# Patient Record
Sex: Female | Born: 1980
Health system: Southern US, Community
[De-identification: ages and names within clinical notes are randomized; demographics above are authoritative.]

## PROBLEM LIST (undated history)

## (undated) DIAGNOSIS — Z789 Other specified health status: Secondary | ICD-10-CM

## (undated) DIAGNOSIS — K529 Noninfective gastroenteritis and colitis, unspecified: Secondary | ICD-10-CM

## (undated) DIAGNOSIS — O24419 Gestational diabetes mellitus in pregnancy, unspecified control: Secondary | ICD-10-CM

## (undated) HISTORY — PX: OTHER SURGICAL HISTORY: SHX169

## (undated) HISTORY — DX: Noninfective gastroenteritis and colitis, unspecified: K52.9

## (undated) HISTORY — DX: Gestational diabetes mellitus in pregnancy, unspecified control: O24.419

## (undated) HISTORY — PX: NO PAST SURGERIES: SHX2092

---

## 2001-06-20 ENCOUNTER — Emergency Department (HOSPITAL_COMMUNITY): Admission: EM | Admit: 2001-06-20 | Discharge: 2001-06-20 | Payer: Self-pay | Admitting: Emergency Medicine

## 2003-08-21 ENCOUNTER — Emergency Department (HOSPITAL_COMMUNITY): Admission: EM | Admit: 2003-08-21 | Discharge: 2003-08-21 | Payer: Self-pay | Admitting: Emergency Medicine

## 2003-12-20 ENCOUNTER — Other Ambulatory Visit: Admission: RE | Admit: 2003-12-20 | Discharge: 2003-12-20 | Payer: Self-pay | Admitting: Obstetrics and Gynecology

## 2004-11-25 ENCOUNTER — Ambulatory Visit (HOSPITAL_COMMUNITY): Admission: RE | Admit: 2004-11-25 | Discharge: 2004-11-25 | Payer: Self-pay | Admitting: Obstetrics and Gynecology

## 2005-01-29 ENCOUNTER — Other Ambulatory Visit: Admission: RE | Admit: 2005-01-29 | Discharge: 2005-01-29 | Payer: Self-pay | Admitting: Obstetrics and Gynecology

## 2013-01-27 ENCOUNTER — Inpatient Hospital Stay (HOSPITAL_COMMUNITY)
Admission: AD | Admit: 2013-01-27 | Discharge: 2013-01-29 | DRG: 781 | Disposition: A | Discharge status: Home / Self Care | Payer: No Typology Code available for payment source | Source: Ambulatory Visit | Attending: Obstetrics and Gynecology | Admitting: Obstetrics and Gynecology

## 2013-01-27 ENCOUNTER — Encounter (HOSPITAL_COMMUNITY): Payer: Self-pay

## 2013-01-27 ENCOUNTER — Inpatient Hospital Stay (HOSPITAL_COMMUNITY)
Admit: 2013-01-27 | Discharge: 2013-01-27 | Disposition: A | Discharge status: Home / Self Care | Payer: No Typology Code available for payment source

## 2013-01-27 ENCOUNTER — Encounter (HOSPITAL_COMMUNITY): Payer: No Typology Code available for payment source

## 2013-01-27 ENCOUNTER — Encounter (HOSPITAL_COMMUNITY): Payer: Self-pay | Admitting: *Deleted

## 2013-01-27 ENCOUNTER — Encounter (HOSPITAL_COMMUNITY): Payer: Self-pay | Admitting: Obstetrics and Gynecology

## 2013-01-27 DIAGNOSIS — O3090 Multiple gestation, unspecified, unspecified trimester: Secondary | ICD-10-CM | POA: Diagnosis present

## 2013-01-27 DIAGNOSIS — IMO0001 Reserved for inherently not codable concepts without codable children: Secondary | ICD-10-CM

## 2013-01-27 DIAGNOSIS — O99810 Abnormal glucose complicating pregnancy: Principal | ICD-10-CM | POA: Diagnosis present

## 2013-01-27 DIAGNOSIS — O24410 Gestational diabetes mellitus in pregnancy, diet controlled: Secondary | ICD-10-CM

## 2013-01-27 DIAGNOSIS — O30009 Twin pregnancy, unspecified number of placenta and unspecified number of amniotic sacs, unspecified trimester: Secondary | ICD-10-CM | POA: Diagnosis present

## 2013-01-27 DIAGNOSIS — O365930 Maternal care for other known or suspected poor fetal growth, third trimester, not applicable or unspecified: Secondary | ICD-10-CM | POA: Diagnosis present

## 2013-01-27 DIAGNOSIS — O365990 Maternal care for other known or suspected poor fetal growth, unspecified trimester, not applicable or unspecified: Secondary | ICD-10-CM | POA: Diagnosis present

## 2013-01-27 DIAGNOSIS — O30049 Twin pregnancy, dichorionic/diamniotic, unspecified trimester: Secondary | ICD-10-CM | POA: Diagnosis present

## 2013-01-27 DIAGNOSIS — O30003 Twin pregnancy, unspecified number of placenta and unspecified number of amniotic sacs, third trimester: Secondary | ICD-10-CM | POA: Diagnosis present

## 2013-01-27 DIAGNOSIS — O30002 Twin pregnancy, unspecified number of placenta and unspecified number of amniotic sacs, second trimester: Secondary | ICD-10-CM

## 2013-01-27 HISTORY — DX: Other specified health status: Z78.9

## 2013-01-27 MED ORDER — PRENATAL MULTIVITAMIN CH
1.0000 | ORAL_TABLET | Freq: Every day | ORAL | Status: DC
  Administered 2013-01-28 – 2013-01-29 (×2): 1 via ORAL
  Filled 2013-01-27 (×2): qty 1

## 2013-01-27 MED ORDER — ZOLPIDEM TARTRATE 5 MG PO TABS: 5.0000 mg | ORAL_TABLET | Freq: Every evening | ORAL | Status: DC | PRN

## 2013-01-27 MED ORDER — ACETAMINOPHEN 325 MG PO TABS
650.0000 mg | ORAL_TABLET | ORAL | Status: DC | PRN
  Administered 2013-01-27: 650 mg via ORAL
  Filled 2013-01-27: qty 2

## 2013-01-27 MED ORDER — POLYSACCHARIDE IRON COMPLEX 150 MG PO CAPS
150.0000 mg | ORAL_CAPSULE | Freq: Every day | ORAL | Status: DC
  Administered 2013-01-27 – 2013-01-29 (×3): 150 mg via ORAL
  Filled 2013-01-27 (×4): qty 1

## 2013-01-27 MED ORDER — DOCUSATE SODIUM 100 MG PO CAPS: 100.0000 mg | ORAL_CAPSULE | Freq: Every day | ORAL | Status: DC

## 2013-01-27 MED ORDER — CALCIUM CARBONATE ANTACID 500 MG PO CHEW: 2.0000 | CHEWABLE_TABLET | ORAL | Status: DC | PRN

## 2013-01-27 NOTE — Progress Notes (Addendum)
Pt stating monitors and belts were becoming uncomfortable and she didn't think she would be able to sleep with the nurse having to come and adjust the monitors so frequently. Called Dr. Cherly Hensen to notify her of patient's complaint at this time; she ordered to keep monitors in place at this time.

## 2013-01-27 NOTE — Consult Note (Signed)
Maternal Fetal Medicine Consultation  Requesting Provider(s): Maxie Better, MD  Reason for consultation: Twin gestation, growth discordance, possible growth restriction of Twin B  HPI: Felicia [name] [place] a 32 yo G3P0020 currently at 69 5/7 weeks with spontaneous DC/DA twin gestation noted earlier today to have significant inter twin growth discordance and growth lag of Twin B.  Her prenatal course has otherwise been uncomplicated.  Her 1-hr OGTT was 156 mg/dl and plans to do a 3-hr OGTT tomorrow and will get her first dose of betamethasone once complete.  She reports that both twins are active, denies contractions, vaginal bleeding or leakage of fluid.  OB History: OB History   Grav Para Term Preterm Abortions TAB SAB Ect Mult Living   3 0 0 0 2 1 1 0 0 0       PMH:  Past Medical History  Diagnosis Date  . Medical history non-contributory     PSH:  Past Surgical History  Procedure Laterality Date  . No past surgeries     Meds:  Scheduled Meds: . docusate sodium  100 mg Oral Daily  . prenatal multivitamin  1 tablet Oral Q1200   Continuous Infusions:  PRN Meds:.acetaminophen, calcium carbonate, zolpidem  Allergies: No Known Allergies  FH: Denies family history of birth defects or hereditary disorders  Soc: denies smoking or ETOH use since becoming pregnant  Review of Systems: no vaginal bleeding or cramping/contractions, no LOF, no nausea/vomiting. All other systems reviewed and are negative.   PE:   Filed Vitals:   01/27/13 1554  BP: 120/77  Pulse: 87  Temp: 98 F (36.7 C)  Resp: 16    GEN: well-appearing female ABD: gravid, NT  Ultrasound:  DC/DA twin gestation with best dates of 28 5/7 weeks A 19 % inter twin growth discordance is noted   A: cephalic, posterior placenta, female      Normal fetal anatomic survey      Estimated fetal weight at the 42nd %tile      Normal amniotic fluid volume (MVP 4.5 cm)  B: breech, posterior placenta, female  Normal fetal anatomic survey      Estimated fetal weight at the 19th %tile; AC measures at the 9th %tile      Normal amniotic fluid volume (MVP 4.6 cm)      UA Dopplers elevated for gestational age, but no AEDF or reversed diastolic flow  A/P: 1) DC/DA twin gestation at 54 5/7 weeks         2) Lagging growth of Twin B (19th %tile) with an AC at the 9th %tile.  Elevated UA Dopplers without absent or reversed diastolic flow - findings and limitations of the ultrasound study were reviewed with the patient and her husband.         3) Abnormal 1-hr OGTT, 3-hr test to be completed tomorrow  Recommendations: - concur with admission and plans for betamethasone series once 3-hr test is complete - After betamethasone is complete, would feel comfortable with outpatient management.  Would recommend weekly BPPs with weekly UA Doppler studies.  May transition to 2x weekly NSTs with weekly UA Doppler studies after 32 weeks or continue weekly BPPs. - Follow up growth scan in 3 weeks - If testing remains reassuring, would move toward delivery at 36-37 weeks.  If marginal interval growth or AEDF/REDF noted on UA Dopplers, would move toward earlier delivery.  Please contact our office if you would prefer that the follow up ultrasound studies be performed with MFM.  Thank you for the opportunity to be a part of the care of Felicia [name]. Please contact our office if we can be of further assistance.   I spent approximately 30 minutes with this patient with over 50% of time spent in face-to-face counseling.  Alpha Gula, MD Maternal Fetal Medicine

## 2013-01-28 DIAGNOSIS — O30003 Twin pregnancy, unspecified number of placenta and unspecified number of amniotic sacs, third trimester: Secondary | ICD-10-CM | POA: Diagnosis present

## 2013-01-28 DIAGNOSIS — O365930 Maternal care for other known or suspected poor fetal growth, third trimester, not applicable or unspecified: Secondary | ICD-10-CM | POA: Diagnosis present

## 2013-01-28 DIAGNOSIS — O30049 Twin pregnancy, dichorionic/diamniotic, unspecified trimester: Secondary | ICD-10-CM | POA: Diagnosis present

## 2013-01-28 LAB — GLUCOSE, 2 HOUR GESTATIONAL: Glucose Tolerance, 2 hour: 185 mg/dL — ABNORMAL HIGH (ref 70–164)

## 2013-01-28 NOTE — H&P (Signed)
CC: elevated doppler, abnl fetal testing( twin B)  HPI; 32 yo G1P0020 MWF w/ known DiDI twin gestation now @ 28 5/[redacted] weeks gestation admitted for further evaluation and mgmt of elevated doppler( twinB) and fetal growth discordancy( >26%) on sonogram done today for serial fetal growth. Sono : 3lb 1 oz(85%), 2lb 4oz( 35%), vtx/oblique breech, nl AFI, nl doppler ( twin A) elev dopplers( twin B). Pt also had elev 1hr GCT today. Pt notes good FM  PMH: NKDA Med PNV Medical none Surgery: D&E OB: TAB x 1 SAB x 1 FH noncontributory SH married nonsmoker ROS neg PE: gravid WDWN F in NAD  Skin (-)lesion HEENT: anicteric sclera. Pink conjunctiva oropharynx neg Cor RRR Lungs clear to A Abdomen: gravid non tender Pelvic: closed/long/OOP Extr: no edema or calf tenderness  IMP: Twin gestation@ 28 5/7 weeks Fetal growth discrepancy abnl 1hr GCT elev doppler( twin B) Anemia P) Admit MFM consult. 3hr GTT in am. Iron supplement. Cont fetal monitoring. BMZ depending on glucose testing

## 2013-01-28 NOTE — Progress Notes (Signed)
efm removed

## 2013-01-28 NOTE — Progress Notes (Signed)
HD #2  28 6/7 weeks Twins  S; no complaint. 3h GTT in progress   O: lungs clear to A Cor RRR abd gravid soft Extr: no edema or calf tenderness  Tracing: twin A baseline 140 Twin B baseline 155 good variability some accelerations. irregular ctx  FBS 86 1 hr= 238 2 hr=185 3hr pending  MFM note reviewed w/ pt and husband. Note appreciated  IMP: newly diagnosed GDM Twin gestation @ 28 6/7 weeks Fetal growth discordancy elevated dopplers ( twin B)  P) will get nutrition/diabetic counselling. FBS, 2hr postprandial glucose. Defer BMZ currently  Change to NST  TID. Disc outpt fetal surveillance. Will do with MFM office

## 2013-01-29 MED ORDER — POLYSACCHARIDE IRON COMPLEX 150 MG PO CAPS: 150.0000 mg | ORAL_CAPSULE | Freq: Two times a day (BID) | ORAL | Status: DC

## 2013-01-29 NOTE — Progress Notes (Signed)
Pt. Requesting to have vs and assessment done at later time this morning.

## 2013-01-29 NOTE — Progress Notes (Signed)
  Nutrition Dx: Food and nutrition-related knowledge deficit r/t no previous education aeb newly diagnosed GDM.    Nutrition education consult for Carbohydrate Modified Gestational Diabetic Diet completed.  "Meal  plan for gestational diabetics" handout given to patient and husband.  Basic concepts reviewed. CHO limit increased to 45 g at breakfast, and 60 g at lunch and dinner for twin pregnancy.  Questions answered.  Patient verbalizes understanding.  Elisabeth Cara M.Odis Luster LDN Neonatal Nutrition Support Specialist Pager [phone]

## 2013-01-29 NOTE — Progress Notes (Addendum)
Fingerstick blood glucose was 79 at 11:30 a.m.  Pt. Aware.  Had visit from hospital dietician today to discuss food choices and preparation at home.  Pt instructed to keep a daily record of blood glucose levels and report abnormal values to her physician.  Pt. For discharge home today s/p NST for twins "A" and "B".   Pt. Is currently on the [facility].

## 2013-01-29 NOTE — Progress Notes (Signed)
TWIN gestation @ 29 weeks Growth discordancy GDM  S: no complaint (+) FM x 2. Ctx not perceived by pt Saw nutirition today  O: VSS afebrile Lungs clear to A  Cor RRR Abdomen: gravid nontender Pelvic deferred Extr no edema  FBS 80 2hr postprandial yest <100  Tracing: baseline 140 (A) Baseline 155   Mod variability, small accel irreg ctx  IMP: Gest DM diet controlled Twin gestation @ 29 weeks w/ growth discrepancy and elev dopplers twinB P) d/c home.  outpt mgmt fetal surveillance( BPP, dopplers weekly). Office will arrange for diabetic supplies.  Will sched OB f/u this week If BS remains as such, will then do BMZ occ ctx PTL prec. Daily kick ct.

## 2013-01-30 LAB — GLUCOSE, CAPILLARY
Glucose-Capillary: 90 mg/dL (ref 70–99)
Glucose-Capillary: 84 mg/dL (ref 70–99)
Glucose-Capillary: 79 mg/dL (ref 70–99)

## 2013-01-31 ENCOUNTER — Other Ambulatory Visit (HOSPITAL_COMMUNITY): Payer: Self-pay | Admitting: Obstetrics and Gynecology

## 2013-01-31 DIAGNOSIS — O30049 Twin pregnancy, dichorionic/diamniotic, unspecified trimester: Secondary | ICD-10-CM

## 2013-02-02 ENCOUNTER — Other Ambulatory Visit (HOSPITAL_COMMUNITY): Payer: Self-pay | Admitting: Obstetrics and Gynecology

## 2013-02-02 ENCOUNTER — Encounter (HOSPITAL_COMMUNITY): Payer: Self-pay

## 2013-02-02 ENCOUNTER — Ambulatory Visit (HOSPITAL_COMMUNITY)
Admission: RE | Admit: 2013-02-02 | Discharge: 2013-02-02 | Disposition: A | Discharge status: Home / Self Care | Payer: No Typology Code available for payment source | Source: Ambulatory Visit | Attending: Obstetrics and Gynecology | Admitting: Obstetrics and Gynecology

## 2013-02-02 VITALS — BP 131/93 | HR 106 | Wt 161.0 lb

## 2013-02-02 DIAGNOSIS — O30049 Twin pregnancy, dichorionic/diamniotic, unspecified trimester: Secondary | ICD-10-CM

## 2013-02-02 DIAGNOSIS — IMO0001 Reserved for inherently not codable concepts without codable children: Secondary | ICD-10-CM

## 2013-02-02 DIAGNOSIS — O365930 Maternal care for other known or suspected poor fetal growth, third trimester, not applicable or unspecified: Secondary | ICD-10-CM

## 2013-02-02 DIAGNOSIS — O99810 Abnormal glucose complicating pregnancy: Secondary | ICD-10-CM | POA: Insufficient documentation

## 2013-02-02 DIAGNOSIS — O0930 Supervision of pregnancy with insufficient antenatal care, unspecified trimester: Secondary | ICD-10-CM | POA: Insufficient documentation

## 2013-02-02 DIAGNOSIS — O30002 Twin pregnancy, unspecified number of placenta and unspecified number of amniotic sacs, second trimester: Secondary | ICD-10-CM

## 2013-02-02 DIAGNOSIS — O30009 Twin pregnancy, unspecified number of placenta and unspecified number of amniotic sacs, unspecified trimester: Secondary | ICD-10-CM | POA: Insufficient documentation

## 2013-02-02 NOTE — Progress Notes (Signed)
Felicia [name]  was seen today for an ultrasound appointment.  See full report in [facility].  Impression: DC/DA twin gestation with best dates of 29 4/7 weeks Follow up due to discordant growth, Twin B with lagging AC and elevated UA Dopplers BPP 8/8 x 2 Normal amniotic fluid volume x 2 UA Dopplers: A- normal for gestational age B- elevated for gestatonal age, but no AEDF or reversed flow  BP: 131/93, 136/91  Recommendations: Continue weekly BPPs with UA Dopplers. Follow up growth scan in 2 weeks  Would have a low threshold to check preeclampsia labs if BPs remain elevated  Alpha Gula, MD

## 2013-02-07 NOTE — Discharge Summary (Signed)
Obstetric Discharge Summary Reason for Admission: twin gestation growth discordancy, elevated dopplers( twin B),  IUP @ 28 5/7 weeks Prenatal Procedures: NST and ultrasound Intrapartum Procedures: none Postpartum Procedures: n/a Complications-Operative and Postpartum: n/a No results found for this basename: hgb, hct    Physical Exam:  General: alert, cooperative and no distress Lungs clear to A Cor RRR Abdomen: gravid nontender DVT Evaluation: No evidence of DVT seen on physical exam . Hospital course: pt was admitted to hospital after routine visit and fetal growth scan showed fetal growth discrepancy with Twin B also having elevated dopplers. Please see H&P for details. Pt also had elevated 1hr GCT. Pt was placed on continuous fetal monitoring and had MFM consult which concurred with diagnosis. 3hr GTT was performed and diagnosis of GDM made. Nutrition consult was done. BS were normal. BMZ deferred at this time. Weekly fetal surveillance planned and outpt management of BS w/ appropriate BS monitor per pt's insurance  Discharge Diagnoses: Gestational diabetes, twin gestation discordant growrth, elevated dopplers twin B, IUP @ 29 weeks  Discharge Information: Date: 02/07/2013 Activity: bed rest Diet: routine and CHO modified diet Medications: PNV Condition: stable Instructions: daily kick ct, PTL prec Discharge to: home Follow-up Information   Follow up with Jazmyne Beauchesne A, MD In 1 week. (office will sched MFM appt, ROB appt 7/28)    Contact information:   [address] [place] [address] [phone]       Aadin Gaut A 02/07/2013, 8:47 AM

## 2013-02-09 ENCOUNTER — Ambulatory Visit (HOSPITAL_COMMUNITY)
Admission: RE | Admit: 2013-02-09 | Discharge: 2013-02-09 | Disposition: A | Discharge status: Home / Self Care | Payer: No Typology Code available for payment source | Source: Ambulatory Visit | Attending: Obstetrics and Gynecology | Admitting: Obstetrics and Gynecology

## 2013-02-09 ENCOUNTER — Inpatient Hospital Stay (HOSPITAL_COMMUNITY)
Admission: AD | Admit: 2013-02-09 | Discharge: 2013-03-03 | DRG: 765 | Disposition: A | Discharge status: Home / Self Care | Payer: No Typology Code available for payment source | Source: Ambulatory Visit | Attending: Obstetrics and Gynecology | Admitting: Obstetrics and Gynecology

## 2013-02-09 ENCOUNTER — Encounter (HOSPITAL_COMMUNITY): Payer: Self-pay | Admitting: *Deleted

## 2013-02-09 DIAGNOSIS — O30049 Twin pregnancy, dichorionic/diamniotic, unspecified trimester: Secondary | ICD-10-CM

## 2013-02-09 DIAGNOSIS — O329XX0 Maternal care for malpresentation of fetus, unspecified, not applicable or unspecified: Principal | ICD-10-CM | POA: Diagnosis present

## 2013-02-09 DIAGNOSIS — IMO0001 Reserved for inherently not codable concepts without codable children: Secondary | ICD-10-CM

## 2013-02-09 DIAGNOSIS — O365990 Maternal care for other known or suspected poor fetal growth, unspecified trimester, not applicable or unspecified: Secondary | ICD-10-CM | POA: Insufficient documentation

## 2013-02-09 DIAGNOSIS — O99810 Abnormal glucose complicating pregnancy: Secondary | ICD-10-CM | POA: Insufficient documentation

## 2013-02-09 DIAGNOSIS — O3090 Multiple gestation, unspecified, unspecified trimester: Principal | ICD-10-CM | POA: Diagnosis present

## 2013-02-09 DIAGNOSIS — D689 Coagulation defect, unspecified: Secondary | ICD-10-CM | POA: Diagnosis not present

## 2013-02-09 DIAGNOSIS — D696 Thrombocytopenia, unspecified: Secondary | ICD-10-CM | POA: Diagnosis not present

## 2013-02-09 DIAGNOSIS — D649 Anemia, unspecified: Secondary | ICD-10-CM | POA: Diagnosis not present

## 2013-02-09 DIAGNOSIS — O30009 Twin pregnancy, unspecified number of placenta and unspecified number of amniotic sacs, unspecified trimester: Secondary | ICD-10-CM | POA: Insufficient documentation

## 2013-02-09 DIAGNOSIS — O30003 Twin pregnancy, unspecified number of placenta and unspecified number of amniotic sacs, third trimester: Secondary | ICD-10-CM | POA: Diagnosis present

## 2013-02-09 DIAGNOSIS — O0930 Supervision of pregnancy with insufficient antenatal care, unspecified trimester: Secondary | ICD-10-CM | POA: Insufficient documentation

## 2013-02-09 DIAGNOSIS — O365930 Maternal care for other known or suspected poor fetal growth, third trimester, not applicable or unspecified: Secondary | ICD-10-CM

## 2013-02-09 DIAGNOSIS — O99814 Abnormal glucose complicating childbirth: Secondary | ICD-10-CM | POA: Diagnosis present

## 2013-02-09 DIAGNOSIS — O365920 Maternal care for other known or suspected poor fetal growth, second trimester, not applicable or unspecified: Secondary | ICD-10-CM

## 2013-02-09 DIAGNOSIS — O9903 Anemia complicating the puerperium: Secondary | ICD-10-CM | POA: Diagnosis not present

## 2013-02-09 LAB — SAMPLE TO BLOOD BANK

## 2013-02-09 LAB — CBC
Hemoglobin: 12.1 g/dL (ref 12.0–15.0)
MCH: 29.6 pg (ref 26.0–34.0)
MCHC: 34 g/dL (ref 30.0–36.0)

## 2013-02-09 LAB — TYPE AND SCREEN: ABO/RH(D): A POS

## 2013-02-09 LAB — ABO/RH: ABO/RH(D): A POS

## 2013-02-09 LAB — COMPREHENSIVE METABOLIC PANEL
ALT: 10 U/L (ref 0–35)
AST: 14 U/L (ref 0–37)
Alkaline Phosphatase: 135 U/L — ABNORMAL HIGH (ref 39–117)
Calcium: 9.4 mg/dL (ref 8.4–10.5)
GFR calc Af Amer: >90 mL/min (ref >90)
Glucose, Bld: 72 mg/dL (ref 70–99)
Potassium: 3.8 mEq/L (ref 3.5–5.1)
Sodium: 135 mEq/L (ref 135–145)
Total Protein: 6.4 g/dL (ref 6.0–8.3)

## 2013-02-09 MED ORDER — BETAMETHASONE SOD PHOS & ACET 6 (3-3) MG/ML IJ SUSP
12.0000 mg | Freq: Once | INTRAMUSCULAR | Status: AC
  Administered 2013-02-09: 12 mg via INTRAMUSCULAR
  Filled 2013-02-09: qty 2

## 2013-02-09 MED ORDER — CALCIUM CARBONATE ANTACID 500 MG PO CHEW
2.0000 | CHEWABLE_TABLET | ORAL | Status: DC | PRN
  Administered 2013-02-11 (×2): 400 mg via ORAL
  Filled 2013-02-09 (×2): qty 2

## 2013-02-09 MED ORDER — MAGNESIUM SULFATE 40 G IN LACTATED RINGERS - SIMPLE
2.0000 g/h | INTRAVENOUS | Status: AC
  Filled 2013-02-09: qty 500

## 2013-02-09 MED ORDER — MAGNESIUM SULFATE BOLUS VIA INFUSION
4.0000 g | Freq: Once | INTRAVENOUS | Status: AC
  Administered 2013-02-09: 4 g via INTRAVENOUS
  Filled 2013-02-09: qty 500

## 2013-02-09 MED ORDER — ACETAMINOPHEN 325 MG PO TABS
650.0000 mg | ORAL_TABLET | ORAL | Status: DC | PRN
  Administered 2013-02-12 – 2013-02-22 (×9): 650 mg via ORAL
  Filled 2013-02-09 (×9): qty 2

## 2013-02-09 MED ORDER — POLYETHYLENE GLYCOL 3350 17 G PO PACK
17.0000 g | PACK | Freq: Every day | ORAL | Status: DC
  Filled 2013-02-09 (×2): qty 1

## 2013-02-09 MED ORDER — DOCUSATE SODIUM 100 MG PO CAPS
100.0000 mg | ORAL_CAPSULE | Freq: Every day | ORAL | Status: DC
  Administered 2013-02-10 – 2013-02-26 (×17): 100 mg via ORAL
  Filled 2013-02-09 (×17): qty 1

## 2013-02-09 MED ORDER — ZOLPIDEM TARTRATE 5 MG PO TABS: 5.0000 mg | ORAL_TABLET | Freq: Every evening | ORAL | Status: DC | PRN

## 2013-02-09 MED ORDER — POLYETHYLENE GLYCOL 3350 17 G PO PACK
17.0000 g | PACK | Freq: Every day | ORAL | Status: DC
  Administered 2013-02-09: 17 g via ORAL
  Filled 2013-02-09: qty 1

## 2013-02-09 MED ORDER — LACTATED RINGERS IV SOLN
INTRAVENOUS | Status: DC
  Administered 2013-02-09 – 2013-02-27 (×3): via INTRAVENOUS

## 2013-02-09 MED ORDER — BETAMETHASONE SOD PHOS & ACET 6 (3-3) MG/ML IJ SUSP
12.0000 mg | Freq: Once | INTRAMUSCULAR | Status: AC
  Administered 2013-02-10: 12 mg via INTRAMUSCULAR
  Filled 2013-02-09: qty 2

## 2013-02-09 MED ORDER — PRENATAL MULTIVITAMIN CH
1.0000 | ORAL_TABLET | Freq: Every day | ORAL | Status: DC
  Administered 2013-02-09 – 2013-02-26 (×18): 1 via ORAL
  Filled 2013-02-09 (×17): qty 1

## 2013-02-09 NOTE — Progress Notes (Signed)
Neo consult done

## 2013-02-09 NOTE — Consult Note (Signed)
Neonatology Consult  Note:  At the request of the patients obstetrician Dr. Cherly Hensen I met with Ms. Fayrene Fearing who is at 30 4 wks currently with pregnancy complicated by  DC/DA twin gestation with discordant growth: Twin B with lagging AC and elevated UA dopplers - 2 tracings with reversed diastolic flow; remainder with absent end diastolic flow.  BPP was 6/8 x 2 with normal amniotic fluid volume.  Currently admitted for fetal monitoring, Betamethasone, magnesium sulfate for neuroprotection and daily Dopplers and BPPs. We reviewed initial delivery room management, including CPAP, [place], and low but certainly possible need for intubation for surfactant administration.  We discussed feeding immaturity and need for full po intake with multiple days of good weight gain and no apnea or bradycardia before discharge.  We reviewed increased risk of jaundice, infection, and temperature instability.   Discussed likely length of stay.  Thank you for allowing [place] to participate in her care.   John Giovanni, DO   Neonatologist The total length of face-to-face or floor / unit time for this encounter was 25 minutes.  Counseling and / or coordination of care was greater than fifty percent of the time.

## 2013-02-09 NOTE — Progress Notes (Signed)
Mag Bolus Started

## 2013-02-09 NOTE — Progress Notes (Signed)
Felicia [name]  was seen today for an ultrasound appointment.  See full report in [facility].  Impression: DC/DA twin gestation with best dates of 30 4/7 weeks Follow up due to discordant growth, Twin B with lagging AC and elevated UA Dopplers BPP 6/8 x 2 (absent breathing movement in both twins) Normal amniotic fluid volume x 2 UA Dopplers: A- normal for gestational age B- 2 tracings with reversed diastolic flow; remainder with absent end diastolic flow Normal ductus venosus waverform  Recommendations: Recommend admission for fetal monitoring - discussed with Dr. Cherly Hensen Betamethasone Daily UA Dopplers and BPPs  Would move toward delivery for non reassuring fetal tracing, persistent reversed diastolic flow on UA Dopplers or abnormal Ductus venosus waveforms on Twin B.  Alpha Gula, MD

## 2013-02-10 ENCOUNTER — Inpatient Hospital Stay (HOSPITAL_COMMUNITY): Payer: No Typology Code available for payment source

## 2013-02-10 LAB — GLUCOSE, CAPILLARY
Glucose-Capillary: 96 mg/dL (ref 70–99)
Glucose-Capillary: 106 mg/dL — ABNORMAL HIGH (ref 70–99)
Glucose-Capillary: 115 mg/dL — ABNORMAL HIGH (ref 70–99)

## 2013-02-10 MED ORDER — POLYETHYLENE GLYCOL 3350 17 G PO PACK
17.0000 g | PACK | Freq: Every day | ORAL | Status: DC
  Administered 2013-02-10 – 2013-02-25 (×16): 17 g via ORAL
  Filled 2013-02-10 (×17): qty 1

## 2013-02-10 NOTE — H&P (Signed)
CC: reverse /absent flow doppler, abnl fetal testing( twin B)  HPI; 32 yo G3P0020 MWF w/ known DiDI twin gestation now @ 30 4/[redacted] weeks gestation re-admitted for further evaluation and mgmt of now reverse/absent doppler  flow( twinB).  Pt was admitted two weeks ago for  fetal growth discordancy( >26%) on sonogram  and elevated dopplers( twin B) Sono : 3lb 1 oz(85%), 2lb 4oz( 35%), vtx/oblique breech, nl AFI, nl doppler ( twin A) elev dopplers( twin B) at that time. During that admission pt was diagnosed with gestational diabetes. Today,  MFM evaluation showed BPP 6/8 x 2 and reverse/absent diastolic flow on twin B  PMH: NKDA Med PNV Medical none Surgery: D&E OB: TAB x 1 SAB x 1 FH noncontributory SH married nonsmoker ROS neg PE: gravid WDWN F in NAD VS BP 123/90, 127/75, 98.2 P82 Skin (-)lesion HEENT: anicteric sclera. Pink conjunctiva oropharynx neg Cor RRR Lungs clear to A Abdomen: gravid non tender Pelvic: deferred Extr: no edema or calf tenderness  IMP: Twin( DC/DA) gestation@ 30 4/7 weeks Fetal growth discrepancy Gestational diabetes Abnl doppler studies( twin B): reverse/absent diastolic flow Anemia  Tracing; baseline A 130 (+) accel  twin B: baseline 125-130 (+) l accels. irreg ctx  P) Admit.  BMZ. BS testing. NICU consult. Daily BPP, dopplers. Cont fetal monitoring. GBS cx . Magnesium sulfate for neuro prophylaxis.

## 2013-02-10 NOTE — Progress Notes (Signed)
Nutrition Note  Chart reviewed due to gestational diabetes diet order. Patient has had gestational diabetes diet education recently. She is diet controlled and glucoses usually run in the 80-90 range. She reports one increased glucose today (126) likely due to steroids. Patient is receiving a CHO-modified gestational diet. Due to twin pregnancy, she receives 45 gm CHO with breakfast and 60 gm with lunch and dinner. Will add snacks between meals to ensure adequate intake and to maintain optimal glucose control. Please consult RD if further nutrition concerns arise.  Joaquin Courts, RD, LDN, CNSC Pager [phone] After Hours Pager [phone]

## 2013-02-10 NOTE — Progress Notes (Signed)
HD #2  30 5/7 weeks BMZ #1 yesterday Magnesium sulfate ( 2g/hr) until 2p Twins( discordant growth) abnl dopplers( twin B)   S: no complaint. Felt more movement last night. Have n't eaten as yet  O: VS BP 88/72 98.3  P78  Lungs clear to A Cor RRR Abd gravid nontender Extr(-)edema or calf tenderness  Tracing: A; baseline 120 -125 (+) accel to 140-145 Twin B: baseline 130 (+) accels to 140-145 irreg ctx  FBS 124  PIH labs nl  IMP: Twin gestation w/ growth discordancy and 2nd twin with abnl( reverse/absent diastolic flow) currently completing Magnesium sulfate IV for neuroprophylaxis,2nd dose of BMZ today.  IUP @ 30 5/7 Class A1 GDM with elev BS due to steroids P) cont inpt mgmt. Disc magnesium IV only for 12 hrs. Dopplers/BPP today. Cont fetal monitoring

## 2013-02-11 ENCOUNTER — Inpatient Hospital Stay (HOSPITAL_COMMUNITY): Payer: No Typology Code available for payment source

## 2013-02-11 LAB — GLUCOSE, CAPILLARY
Glucose-Capillary: 132 mg/dL — ABNORMAL HIGH (ref 70–99)
Glucose-Capillary: 106 mg/dL — ABNORMAL HIGH (ref 70–99)
Glucose-Capillary: 92 mg/dL (ref 70–99)

## 2013-02-11 MED ORDER — PANTOPRAZOLE SODIUM 40 MG PO TBEC
40.0000 mg | DELAYED_RELEASE_TABLET | Freq: Every day | ORAL | Status: DC
  Administered 2013-02-11 – 2013-02-26 (×16): 40 mg via ORAL
  Filled 2013-02-11 (×18): qty 1

## 2013-02-11 NOTE — Progress Notes (Signed)
HD #3  30 6/7 weeks BMZ complete Magnesium sulfate complete Twins( discordant growth) abnl dopplers( twin B)   S: c/o reflux  (+) FM no ctx  O:last night:  98.7  BP 124/75 VS not available this am  Lungs clear to A Cor RRR Abd gravid nontender Extr(-)edema or calf tenderness  Tracing: B; baseline 130 -135 (+) accel to 160 Twin A: baseline 140 (+) accels to 160 irreg ctx 8/8: 124    106 115 8/9 132 sono yesterday: vtx/vtx  BPP 6/8 x 2, nl fluid. Twin B w/ elevation of doppler studies  and some absent diastolic flow GBS cx pending IMP: Twin gestation w/ growth discordancy and 2nd twin with abnl dopplers but stable,. Reassuring fetal tracing IUP @ 30 6/7 weeks Class A1 GDM with elev BS due to steroids GERD P) cont inpt mgmt. Cont BS testing. BPP/dopplers today. HOB elevation and add protonix

## 2013-02-11 NOTE — Progress Notes (Signed)
[id number]:  Pt to U/S then up to shower.  Not on EFM at this time.

## 2013-02-12 ENCOUNTER — Inpatient Hospital Stay (HOSPITAL_COMMUNITY): Payer: No Typology Code available for payment source

## 2013-02-12 LAB — GLUCOSE, CAPILLARY
Glucose-Capillary: 87 mg/dL (ref 70–99)
Glucose-Capillary: 67 mg/dL — ABNORMAL LOW (ref 70–99)

## 2013-02-12 LAB — CULTURE, BETA STREP (GROUP B ONLY)

## 2013-02-12 NOTE — Progress Notes (Addendum)
ANTEPARTUM NOTE - HD #4  Subjective: Reports feeling tired, not sleeping well Tolerating po intake / no nausea / no vomiting  Voiding QS Report no bleeding or LOF, mild cramping at times Fetal activity present       Objective: Vital signs: VS: Blood pressure 118/74, pulse 79, temperature 97.8 F (36.6 C), temperature source Oral, resp. rate 18, height 5\' 6"  (1.676 m), weight 73.483 kg (162 lb), last menstrual period 06/05/2012, SpO2 99.00%.  SONO:  BPP: Twin A: 6/8, Twin B: 8/8 Dopplers: Twin A-normal, Twin B-normal  Physical exam: General appearance/behavior: alert and oriented x3       Fetal Assessment: FHR A-155, moderate variability, +accels, no decels FHR B-145, moderate variability, + accels, no decels TOCO neg  Assessment: 31 weeks twin gestation, growth discordance, abnormal doppler twin B A1GDM-stable FHR category I  Plan:  Continue BS monitoring Daily BPP/dopplers Continue inpatient managment   Donette Larry, N MSN, CNM 02/12/2013, 12:37 PM  Pt's history and u/s reviewed. U/s from today showing nl dopplers twin A. Twin B with elevated dopplers >97% and intermittent absent EDF, no reversal of flow. MFM reccs repeat dopplers tomorrow. A1GDM, nl BS today. Will plan continuous monitoring o/n and re-eval dopplers tomorrow. NPO after breakfast in am, given possibility of c/s if dopplers consistently absent on twin B, also in breech presentation.

## 2013-02-12 NOTE — Progress Notes (Signed)
Obstetric ultrasound performed today.   Study limited today to evaluation of BPP and umbilical artery Doppler measurements.   Dichorionic/Diamniotic twin gestation at 30w 6d Borderline discordance (19% on 01/27/13)  A: Cephalic, posterior placenta, female      Normal amniotic fluid volume       Equivocal BPP      Normal umbilical artery Doppler measurements            Appropriate fetal growth (42nd percentile on 01/27/13)  B: Breech, posterior placenta, female      Normal amniotic fluid volume       Equivocal BPP      Elevated umbilical artery S:D with intermittent absent end diastolic flow (no reversed flow)      Lagging fetal growth (19th percentile with all biometric measurements <10th percentile on 01/27/13)  Continue close maternal and fetal surveillance as an inpatient.  Repeat ultrasound tomorrow (02/12/13) to re evaluate umbilical artery Doppler measurements and BPP.   If patient remains undelivered, recommend repeat evaluation of fetal growth in 1 week.   Please see full report in [facility]

## 2013-02-12 NOTE — Progress Notes (Signed)
Obstetric ultrasound performed today.   Study limited today to evaluation of BPP and umbilical artery Doppler measurements.   Dichorionic/Diamniotic twin gestation at 53w 0d Borderline discordance (19% on 01/27/13)  A: Cephalic, posterior placenta, female      Normal amniotic fluid volume       Persistent equivocal BPP      Normal umbilical artery Doppler measurements            Appropriate fetal growth (42nd percentile on 01/27/13)  B: Breech, posterior placenta, female      Normal amniotic fluid volume       Reassuring BPP      Elevated umbilical artery S:D with intermittent absent end diastolic flow (no reversed flow)      Lagging fetal growth (19th percentile with all biometric measurements <10th percentile on 01/27/13)  Continue close maternal and fetal surveillance as an inpatient.  Repeat ultrasound tomorrow (02/13/13) to re evaluate umbilical artery Doppler measurements and BPP.   If patient remains undelivered, recommend repeat evaluation of fetal growth in 1 week.   Please see full report in [facility]

## 2013-02-13 ENCOUNTER — Inpatient Hospital Stay (HOSPITAL_COMMUNITY): Payer: No Typology Code available for payment source

## 2013-02-13 LAB — GLUCOSE, CAPILLARY
Glucose-Capillary: 128 mg/dL — ABNORMAL HIGH (ref 70–99)
Glucose-Capillary: 70 mg/dL (ref 70–99)

## 2013-02-13 MED ORDER — POLYSACCHARIDE IRON COMPLEX 150 MG PO CAPS
150.0000 mg | ORAL_CAPSULE | Freq: Every day | ORAL | Status: DC
  Administered 2013-02-13 – 2013-02-26 (×14): 150 mg via ORAL
  Filled 2013-02-13 (×15): qty 1

## 2013-02-13 NOTE — Progress Notes (Signed)
HD #5  31 1/7 weeks BMZ complete Magnesium sulfate complete Twins Di-DI ( discordant growth) abnl dopplers( twin B) GBS cx neg  S: reflux better on Protonix. (+) FM no ctx noted by pt  O: VS T98.8 BP 117/81 P82  Lungs clear to A Cor RRR Abd gravid nontender Extr(-)edema or calf tenderness  Tracing: A baseline 140 (+) accel to 160 Twin B: baseline 145 (+) accel to 150's -160 occ ctx BS:  sono today: vtx/breech GBS cx  neg IMP: Twin gestation w/ growth discordancy and 2nd twin with abnl dopplers but stable,.  BPP 6/8 x 2 intermittent absent flow/reverse on twin B high nl doppler on twinA Reassuring fetal tracing x 2 IUP @ 31 1/7 weeks Class A1 GDM on diet with occ elevation GERD on protonix Anemia of pregnancy  P) cont inpt mgmt. Cont BS testing. Fetal growth next week. Start iron supplement. Cont close fetal surveillance. Repeat dopplers in am

## 2013-02-13 NOTE — Progress Notes (Signed)
Pt. Sleeping and monitors have moved. Tracing baby B well, but not able to trace baby A. Letting pt. Sleep per her request

## 2013-02-14 ENCOUNTER — Inpatient Hospital Stay (HOSPITAL_COMMUNITY): Payer: No Typology Code available for payment source

## 2013-02-14 LAB — GLUCOSE, CAPILLARY: Glucose-Capillary: 86 mg/dL (ref 70–99)

## 2013-02-14 NOTE — Progress Notes (Signed)
HD #6  31 2/7 weeks S/P BMZ S/P Magnesium sulfate Twins Di-DI ( discordant growth) abnl dopplers( twin B) GBS cx neg  S: (+) FM no ctx noted by pt  O: VSS Afebrile  Lungs clear to A Cor RRR Abd gravid nontender Extr(-)edema or calf tenderness Pelvic: deferred  Tracing: A baseline 145 (+) accel to 150-160 Twin B: baseline 140 (+) accel to 160 occ ctx sono today: vtx/breech  IMP: Twin gestation w/ growth discordancy and 2nd twin with abnl dopplers but stable,.  BPP 8/8 x 2 intermittent absent flow on twin B . Reassuring fetal tracing x 2 IUP @ 31 2/7 weeks Class A1 GDM on diet with occ elevation GERD on protonix Anemia of pregnancy. On iron supplement  P) cont inpt mgmt.  Change to NST q shift Cont close fetal surveillance. Repeat dopplers/BPP in am

## 2013-02-15 ENCOUNTER — Inpatient Hospital Stay (HOSPITAL_COMMUNITY): Payer: No Typology Code available for payment source

## 2013-02-15 LAB — GLUCOSE, CAPILLARY: Glucose-Capillary: 92 mg/dL (ref 70–99)

## 2013-02-15 NOTE — Progress Notes (Signed)
[facility] ultrasound  Indication: 32 yr old G22P0020 at [redacted]w[redacted]d with dichorionic/diamniotic twin gestation with fetal growth restriction and abnormal Doppler studies in twin B for Doppler studies and BPPs.  Findings: 1. Dichorionic/diamniotic twin gestation; the dividing membrane is seen. 2. Both placentas are posterior without evidence of previa. 3. Twin A is in cephalic presentation; twin B is in breech presentation. 4. Normal amniotic fluid volume for both fetuses. 5. Normal biophysical profiles of 8/8 for both fetuses. 6. Normal umbilical artery Doppler studies for twin A: twin B with elevated S/D ratio; no absent or reversed flow seen on today's exam. 7. Normal ductus venosus Doppler studies in twin B.  Recommendations: 1. Twin gestation: - previously counseled 2. Fetal growth restriction in twin B: - recommend fetal growth in 2 weeks - s/p betamethasone 3. Abnormal Doppler studies in twin B: - stable today; somewhat improved - recommend continue intermittent fetal monitoring- recommend TID or more frequently if clinically indicated - recommend obtain Doppler studies every other day; or more often if clinically indicated - s/p betamethasone 4. Continue inpatient management 5. Recommend delivery for nonreassuring fetal status, no or poor interval fetal growth, reversal of flow on ductus venosus Doppler studies, and consider delivery for persistent reversal of flow on umbilical artery Doppler studies especially if >32 weeks  Eulis Foster, MD

## 2013-02-15 NOTE — Progress Notes (Signed)
HD #6  31 3/7 weeks S/P BMZ,  Magnesium sulfate Twins Di-DI ( discordant growth) VTX/Breech abnl dopplers( twin B) GBS cx neg  S: (+) FM  Doing better with protonix  O: VSS Afebrile  Lungs clear to A Cor RRR Abd gravid nontender Extr(-)edema or calf tenderness   Tracing: A baseline 150 (+) accel to 180 Twin B: baseline 140 (+) accel to 150 (-) ctx BS 77/ 99/ 104  IMP: Twin gestation w/ growth discordancy and 2nd twin with abnl dopplers improved,.  BPP 8/8 x 2 elevated doppler twin B/ no absent or reverse flow.  Twin A. Nl dopplers .Marland Kitchen  Reassuring fetal tracing x 2 IUP @ 31 3/7 weeks Class A1 GDM diet controlled GERD on protonix Anemia of pregnancy. On iron supplement  P) cont inpt mgmt.  Cont  NST q shift Cont close fetal surveillance. Repeat dopplers/BPP 8/15

## 2013-02-16 ENCOUNTER — Ambulatory Visit (HOSPITAL_COMMUNITY): Admission: RE | Admit: 2013-02-16 | Payer: No Typology Code available for payment source | Source: Ambulatory Visit

## 2013-02-16 LAB — GLUCOSE, CAPILLARY
Glucose-Capillary: 89 mg/dL (ref 70–99)
Glucose-Capillary: 69 mg/dL — ABNORMAL LOW (ref 70–99)
Glucose-Capillary: 127 mg/dL — ABNORMAL HIGH (ref 70–99)
Glucose-Capillary: 97 mg/dL (ref 70–99)

## 2013-02-16 MED ORDER — LORATADINE 10 MG PO TABS
10.0000 mg | ORAL_TABLET | Freq: Every day | ORAL | Status: DC
  Administered 2013-02-16 – 2013-02-26 (×11): 10 mg via ORAL
  Filled 2013-02-16 (×12): qty 1

## 2013-02-16 MED ORDER — PSEUDOEPHEDRINE HCL 30 MG PO TABS: 30.0000 mg | ORAL_TABLET | Freq: Four times a day (QID) | ORAL | Status: DC | PRN

## 2013-02-16 NOTE — Progress Notes (Signed)
HD #6  31 4/7 weeks S/P BMZ,  Magnesium sulfate Twins Di-DI ( discordant growth) VTX/Breech abnl dopplers( twin B) GBS cx neg  S: (+) FM  Denies ctx. C/o h/a hx allergies  O: VSS Afebrile  Lungs clear to A Cor RRR Abd gravid nontender Extr(-)edema or calf tenderness   Tracing: A baseline 145(+) accels Twin B: baseline 135 good variability (+) accels (-) ctx BS ( 8/14) 89/69/127.  Pt spoke to nutrition to increase portions  IMP: Twin gestation w/ growth discordancy and 2nd twin with abnl dopplers  Reassuring fetal tracing x 2 IUP @ 31 4/7 weeks Class A1 GDM diet controlled GERD on protonix Anemia of pregnancy. On iron supplement Sinus h/a  P) cont inpt mgmt.  Cont  NST q shift  Cont close fetal surveillance. BPP/dopplers in am. Claritin/sudafed ok for wheelchair ride

## 2013-02-17 ENCOUNTER — Inpatient Hospital Stay (HOSPITAL_COMMUNITY): Payer: No Typology Code available for payment source

## 2013-02-17 LAB — GLUCOSE, CAPILLARY

## 2013-02-17 NOTE — Progress Notes (Signed)
Plan had been made with pt since this morning to restart saline lock and note made on pt board as plan for day. When entered room pt states she is not ready and she does not know when she will be ready since it is a painful procedure. She said she needs more time to think about it. Explained importance of rotating IV sites. Pt states last iv was started late at night. Will discuss with dr cousins when she makes rounds.

## 2013-02-17 NOTE — Progress Notes (Signed)
Pt has been encouraged to wear SCD"s today. Pt states she does not wear them. She said that she feels she moves around enough and does not need them. Discussed with pt they were important for any pt who was in the bed so that they do not get blood clots in their legs. Pt states she understands but feels she gets up and walks enough and does not need to wear them.

## 2013-02-17 NOTE — Progress Notes (Signed)
HD #7  31 5/7 weeks S/P BMZ,  Magnesium sulfate Twins Di-DI ( discordant growth)  abnl dopplers( twin B) GBS cx neg  S: (+) FM feeling better h/a resolved  O: VSS Afebrile  Lungs clear to A Cor RRR Abd gravid nontender Extr(-)edema or calf tenderness   Tracing: A baseline 145-150(+) accels to 160 Twin B: baseline 160 good variability (+) accels to 180 Rare ctx BS ( 8/15) 86/85/108  IMP: Twin gestation w/ growth discordancy and 2nd twin with abnl dopplers  Reassuring fetal tracing x 2 IUP @ 31 5/7 weeks Class A1 GDM diet controlled GERD  Anemia of pregnancy. On iron   P) cont inpt mgmt.  Cont  NST q shift  Cont close fetal surveillance. BPP/dopplers  M/W/F unless tracing dictates otherwise

## 2013-02-18 LAB — GLUCOSE, CAPILLARY
Glucose-Capillary: 93 mg/dL (ref 70–99)
Glucose-Capillary: 89 mg/dL (ref 70–99)
Glucose-Capillary: 75 mg/dL (ref 70–99)

## 2013-02-18 LAB — TYPE AND SCREEN

## 2013-02-18 NOTE — Progress Notes (Signed)
:   Hd#8  31 6/7 wk Twins BMZ/Magnesium complete Fetal discordant growth ( twin B) Abnl dopplers (twin B) Class A1 GDM  S: no complaint (+) active fetus/ ? Answered regarding SCD stocking  O:  Afebrile. BP  Lungs clear to A Cor RRR w/o murmur Abd gravid soft Extr)(-) edema, calf tenderness  Tracing:A: baseline 140  Small accel Twin B' baseline 150 (+) accel to 160  BS:  93/89 8/15: twin A BPP 8/8 high normal dopplers Twin B; no reverse/absent flow BPP 6/8  IMP: twin gestation @ 31 6/7 weeks w/ known fetal discordancy( twin B).  Followed inpt due to abnl dopplers. Stable on yesterday study Class A1 GDM diet  P) cont present mgmt. Monitor tracing. Next BPP/doppler Monday unless tracing concerning

## 2013-02-19 LAB — GLUCOSE, CAPILLARY
Glucose-Capillary: 103 mg/dL — ABNORMAL HIGH (ref 70–99)
Glucose-Capillary: 77 mg/dL (ref 70–99)

## 2013-02-19 NOTE — Progress Notes (Signed)
:   Hd#9  32 wk Twins BMZ/Magnesium complete Fetal discordant growth ( twin B) Abnl dopplers (twin B) Class A1 GDM  S: no complaint (+) active fetuses  O:  VSS Afebrile  Lungs clear to A Cor RRR w/o murmur Abd gravid soft Extr)(-) edema, calf tenderness  Tracing:A: baseline 140 (+) accel to 155 Twin B' baseline 130 (+) accel to 150  BS:  92/83    IMP: twin gestation @ 32 weeks w/ known fetal discordancy( twin B).  Followed inpt due to abnl dopplers. Stable  Class A1 GDM diet controlled  P) cont present mgmt. BPP/doppler tomorrow

## 2013-02-20 ENCOUNTER — Inpatient Hospital Stay (HOSPITAL_COMMUNITY): Payer: No Typology Code available for payment source

## 2013-02-20 LAB — GLUCOSE, CAPILLARY

## 2013-02-20 LAB — TYPE AND SCREEN
ABO/RH(D): A POS
Antibody Screen: NEGATIVE

## 2013-02-20 NOTE — Progress Notes (Signed)
Ur chart review completed.  

## 2013-02-21 LAB — GLUCOSE, CAPILLARY
Glucose-Capillary: 76 mg/dL (ref 70–99)
Glucose-Capillary: 85 mg/dL (ref 70–99)

## 2013-02-21 NOTE — Progress Notes (Signed)
02/21/13 1200  Clinical Encounter Type  Visited With Patient  Visit Type Initial;Spiritual support;Social support  Spiritual Encounters  Spiritual Needs Brewing technologist was very positive and upbeat on this initial visit.  She reports great support from a big family (fiance Baxter's is local, and her parents live an hour away), gratitude that they have made it this far in the pregnancy, appreciation for great support and care here at [facility], and a sense of emotional preparedness after having a NICU tour and opportunity to process what a NICU stay might be like for her family.  Provided intro to spiritual care services and chaplain availability, reflective listening, and encouragement.  Will follow for support.  [address] [place], [place] [phone]

## 2013-02-21 NOTE — Progress Notes (Signed)
HD 32 2/7 weeks Twins Class A1 GDM  S: notes small hard stools. Using Miralax Active babies  O: VSS Afebrile Lungs clear to A Cor RRR Abd: gravid soft Ext no edema  Tracing: (A) baseline 150 reactive (+) accels (B)  Baseline 145 (+) accel BS: 76  IMP: Twin gestation w/ growth discordancy Class A1 GDM IUP@ 32 12/7  P) BPP/doppler Wednesday. Fetal growth 8/25. Smooth move tea

## 2013-02-21 NOTE — Progress Notes (Signed)
HD 32 1/7 weeks Twins Class A1 GDM  S: very active fetuses  O: VSS Afebrile Lungs clear to A Cor RRR Abd: gravid soft Ext no edema  Sono: BPP 6/8 ( A) BPP 8/8(B) Dopplers( B) absent flow / no reverse   Tracing reviewed:(A) baseline 150 (+) accels Twin B  Baseline 145 (+) accels to 155 occ ctx   BS: 85/73/94  IMP: Twin gestation w/ growth discordancy Class A1 GDM IUP@ 32 1/7  P) Reassuring testing twins  BPP/doppler Wednesday. Fetal growth 8/25

## 2013-02-22 ENCOUNTER — Other Ambulatory Visit (HOSPITAL_COMMUNITY): Payer: No Typology Code available for payment source

## 2013-02-22 ENCOUNTER — Ambulatory Visit (HOSPITAL_COMMUNITY): Payer: No Typology Code available for payment source

## 2013-02-22 LAB — GLUCOSE, CAPILLARY: Glucose-Capillary: 86 mg/dL (ref 70–99)

## 2013-02-22 NOTE — Progress Notes (Signed)
Drue Flirt  was seen today for an ultrasound appointment.  See full report in [facility].  Impression: Dichorionic/diamniotic twin pregnancy at 32+3 weeks Normal amniotic fluid volume x 2 Twin A: UA Dopplers were normal for this GA; BPP 6/8 (- 2 for absent breathing movement) Twin B: UA Dopplers elevated with some intermittent absent diastolic flow.  BPP 8/8.  Recommendations: Please coorelate fetal heart rate tracings with BPPs for Twin A. Recommend BPPs with UA Dopplers every Monday, Wednesday and Friday Follow up growth scan next week Would move toward delivery for non reassuring fetal tracing,  persistent reversed diastolic flow on UA Dopplers or reversed flow on Ductus venosus waveform or poor interval growth.  Alpha Gula, MD

## 2013-02-22 NOTE — Progress Notes (Signed)
HD 32 3/7 weeks Twins/growth discordancy/abnl dopplers Class A1 GDM  S: notes active babies in pm  O: VSS Afebrile Lungs clear to A Cor RRR Abd: gravid soft Ext no edema/calf tenderness  Tracing: (A) baseline 145-150 reactive (+) accels to 170 small variables (B)  Baseline 140-145 (+) accel to 160 BS: 71/91  sono:A: BPP 6/8 Twin B 8/8   Absent flow no reverse  IMP: Twin gestation w/ growth discordancy/abnl dopplers Class A1 GDM well controlled IUP@ 32 3/7  P) BPP/doppler FRI cont present inpt status

## 2013-02-23 ENCOUNTER — Other Ambulatory Visit (HOSPITAL_COMMUNITY): Payer: No Typology Code available for payment source

## 2013-02-23 LAB — GLUCOSE, CAPILLARY
Glucose-Capillary: 82 mg/dL (ref 70–99)
Glucose-Capillary: 84 mg/dL (ref 70–99)

## 2013-02-23 LAB — TYPE AND SCREEN
ABO/RH(D): A POS
Antibody Screen: NEGATIVE

## 2013-02-23 NOTE — Progress Notes (Signed)
HD 32 4/7 weeks Twins/growth discordancy/abnl dopplers Class A1 GDM  S: no complaints (+) FM  O: VSS Afebrile Lungs clear to A Cor RRR Abd: gravid soft Ext no edema/calf tenderness  BS: 82   NST not done as yet  IMP: Twin gestation w/ growth discordancy/abnl dopplers Class A1 GDM well controlled IUP@ 32 4/7  P) BPP/doppler tomorrow cont present inpt status. May have a cake today for B-day

## 2013-02-23 NOTE — Progress Notes (Signed)
Have been following patient's glucose since admission. Dx'd with GDM prior to admission Meal plan as ordered has been effective at control.  Since pt will most likely be here until delivery, will continue to follow and recommend as needed. Thank you, Lenor Coffin, RN, Diabetes CNS [phone])

## 2013-02-24 ENCOUNTER — Inpatient Hospital Stay (HOSPITAL_COMMUNITY): Payer: No Typology Code available for payment source

## 2013-02-24 NOTE — Progress Notes (Signed)
Felicia [name]  was seen today for an ultrasound appointment.  See full report in [facility].  Impression: Dichorionic/diamniotic twin pregnancy at 32+5 weeks Normal amniotic fluid volume x 2 Twin A: UA Dopplers were normal for this GA; BPP 8/10 (- 2 for absent breathing movement; reactive NST on antepartum unit earlier today) Twin B: UA Dopplers elevated with some intermittent absent diastolic flow.  BPP 8/8.  Recommendations: Continue BPPs with UA Dopplers every Monday, Wednesday and Friday Follow up growth scan next week Would move toward delivery for non reassuring fetal tracing,  persistent reversed diastolic flow on UA Dopplers, reversed flow on Ductus venosus waveform or poor interval growth.  Alpha Gula, MD

## 2013-02-24 NOTE — Progress Notes (Signed)
To MFM for ultrasound.

## 2013-02-25 LAB — GLUCOSE, CAPILLARY: Glucose-Capillary: 107 mg/dL — ABNORMAL HIGH (ref 70–99)

## 2013-02-25 NOTE — Progress Notes (Signed)
HD 32 5/7 weeks Twins/growth discordancy/abnl dopplers Class A1 GDM well controlled  S: no complaints (+) FM( acitve)  O: VSS Afebrile Lungs clear to A Cor RRR Abd: gravid soft Ext no edema/calf tenderness  BS: 97/78/98/91   Tracing: baseline twin A 150 (+) accels to 165-170 Twin B baseline 140 (+) accels to 155  sono today> BPP x 2= 8/8. Intermittent absent flow twin B  IMP: Twin gestation w/ growth discordancy/abnl dopplers Class A1 GDM well controlled IUP@ 32 5/7  P) cont inpt mgmt. Repeat studies on  Monday with fetal growth

## 2013-02-25 NOTE — Progress Notes (Signed)
HD 32 6/7 weeks Twins/growth discordancy/abnl dopplers Class A1 GDM well controlled  S: no complaints (+) FM( acitve)  O: VSS Afebrile Lungs clear to A Cor RRR Abd: gravid soft Ext no edema/calf tenderness BS: 80/84   Twin A: baseline 150's (+) accel to 165 Twin B: baseline 140 (+) 160  IMP: Twin gestation w/ growth discordancy/abnl dopplers Class A1 GDM well controlled IUP@ 32 6/7  P) cont inpt mgmt. Repeat studies planned for  Monday with fetal growth

## 2013-02-26 LAB — GLUCOSE, CAPILLARY
Glucose-Capillary: 80 mg/dL (ref 70–99)
Glucose-Capillary: 81 mg/dL (ref 70–99)
Glucose-Capillary: 97 mg/dL (ref 70–99)

## 2013-02-26 NOTE — Progress Notes (Signed)
HD 33 weeks Twins/growth discordancy/abnl dopplers Class A1 GDM well controlled  S: no complaints (+) FM x 2  O: VSS Afebrile Lungs clear to A Cor RRR w/o murmur Abd: gravid soft Ext no edema/calf tenderness  NST not done as yet. Yesterday: NST x 2 reactive  IMP: Twin gestation w/ growth discordancy/abnl dopplers Class A1 GDM well controlled IUP@ 33 wk  P) sono tomorrow

## 2013-02-27 ENCOUNTER — Encounter (HOSPITAL_COMMUNITY): Payer: Self-pay | Admitting: Anesthesiology

## 2013-02-27 ENCOUNTER — Inpatient Hospital Stay (HOSPITAL_COMMUNITY): Payer: No Typology Code available for payment source | Admitting: Anesthesiology

## 2013-02-27 ENCOUNTER — Encounter (HOSPITAL_COMMUNITY)
Admission: AD | Disposition: A | Discharge status: Home / Self Care | Payer: Self-pay | Source: Ambulatory Visit | Attending: Obstetrics and Gynecology

## 2013-02-27 ENCOUNTER — Inpatient Hospital Stay (HOSPITAL_COMMUNITY)
Admission: RE | Admit: 2013-02-27 | Discharge: 2013-02-27 | Disposition: A | Discharge status: Home / Self Care | Payer: No Typology Code available for payment source | Source: Ambulatory Visit | Attending: Obstetrics and Gynecology | Admitting: Obstetrics and Gynecology

## 2013-02-27 LAB — CBC
HCT: 36.9 % (ref 36.0–46.0)
Hemoglobin: 12.7 g/dL (ref 12.0–15.0)
MCHC: 34.4 g/dL (ref 30.0–36.0)

## 2013-02-27 LAB — GLUCOSE, CAPILLARY: Glucose-Capillary: 80 mg/dL (ref 70–99)

## 2013-02-27 SURGERY — Surgical Case
Anesthesia: Spinal | Site: Abdomen | Wound class: Clean Contaminated

## 2013-02-27 MED ORDER — OXYCODONE-ACETAMINOPHEN 5-325 MG PO TABS
1.0000 | ORAL_TABLET | ORAL | Status: DC | PRN
  Administered 2013-02-27: 1 via ORAL
  Administered 2013-02-28 – 2013-03-03 (×18): 2 via ORAL
  Filled 2013-02-27 (×8): qty 2
  Filled 2013-02-27: qty 1
  Filled 2013-02-27 (×10): qty 2

## 2013-02-27 MED ORDER — SCOPOLAMINE 1 MG/3DAYS TD PT72
1.0000 | MEDICATED_PATCH | Freq: Once | TRANSDERMAL | Status: AC
  Administered 2013-02-27: 1.5 mg via TRANSDERMAL

## 2013-02-27 MED ORDER — POLYETHYLENE GLYCOL 3350 17 G PO PACK
17.0000 g | PACK | Freq: Every day | ORAL | Status: DC
  Administered 2013-02-28 – 2013-03-01 (×2): 17 g via ORAL
  Filled 2013-02-27 (×4): qty 1

## 2013-02-27 MED ORDER — MEPERIDINE HCL 25 MG/ML IJ SOLN
INTRAMUSCULAR | Status: AC
  Filled 2013-02-27: qty 1

## 2013-02-27 MED ORDER — PHENYLEPHRINE 40 MCG/ML (10ML) SYRINGE FOR IV PUSH (FOR BLOOD PRESSURE SUPPORT)
PREFILLED_SYRINGE | INTRAVENOUS | Status: AC
  Filled 2013-02-27: qty 10

## 2013-02-27 MED ORDER — SODIUM CHLORIDE 0.9 % IJ SOLN: 3.0000 mL | INTRAMUSCULAR | Status: DC | PRN

## 2013-02-27 MED ORDER — HYDROMORPHONE HCL PF 1 MG/ML IJ SOLN
INTRAMUSCULAR | Status: AC
  Filled 2013-02-27: qty 1

## 2013-02-27 MED ORDER — FLEET ENEMA 7-19 GM/118ML RE ENEM: 1.0000 | ENEMA | Freq: Every day | RECTAL | Status: DC | PRN

## 2013-02-27 MED ORDER — PHENYLEPHRINE HCL 10 MG/ML IJ SOLN
INTRAMUSCULAR | Status: DC | PRN
  Administered 2013-02-27: 40 ug via INTRAVENOUS
  Administered 2013-02-27: 80 ug via INTRAVENOUS
  Administered 2013-02-27: 40 ug via INTRAVENOUS
  Administered 2013-02-27 (×3): 80 ug via INTRAVENOUS

## 2013-02-27 MED ORDER — TETANUS-DIPHTH-ACELL PERTUSSIS 5-2.5-18.5 LF-MCG/0.5 IM SUSP: 0.5000 mL | Freq: Once | INTRAMUSCULAR | Status: DC

## 2013-02-27 MED ORDER — CEFAZOLIN SODIUM-DEXTROSE 2-3 GM-% IV SOLR
INTRAVENOUS | Status: AC
  Filled 2013-02-27: qty 50

## 2013-02-27 MED ORDER — ONDANSETRON HCL 4 MG/2ML IJ SOLN: 4.0000 mg | INTRAMUSCULAR | Status: DC | PRN

## 2013-02-27 MED ORDER — SCOPOLAMINE 1 MG/3DAYS TD PT72
MEDICATED_PATCH | TRANSDERMAL | Status: AC
  Filled 2013-02-27: qty 1

## 2013-02-27 MED ORDER — DIPHENHYDRAMINE HCL 25 MG PO CAPS
25.0000 mg | ORAL_CAPSULE | ORAL | Status: DC | PRN
  Filled 2013-02-27: qty 1

## 2013-02-27 MED ORDER — SIMETHICONE 80 MG PO CHEW
80.0000 mg | CHEWABLE_TABLET | Freq: Three times a day (TID) | ORAL | Status: DC
  Administered 2013-02-27 – 2013-03-03 (×13): 80 mg via ORAL

## 2013-02-27 MED ORDER — DIPHENHYDRAMINE HCL 50 MG/ML IJ SOLN: 25.0000 mg | INTRAMUSCULAR | Status: DC | PRN

## 2013-02-27 MED ORDER — SIMETHICONE 80 MG PO CHEW: 80.0000 mg | CHEWABLE_TABLET | ORAL | Status: DC | PRN

## 2013-02-27 MED ORDER — FENTANYL CITRATE 0.05 MG/ML IJ SOLN
INTRAMUSCULAR | Status: AC
  Filled 2013-02-27: qty 2

## 2013-02-27 MED ORDER — HYDROMORPHONE HCL PF 1 MG/ML IJ SOLN
0.5000 mg | INTRAMUSCULAR | Status: DC | PRN
  Administered 2013-02-27: 0.5 mg via INTRAVENOUS

## 2013-02-27 MED ORDER — NALBUPHINE SYRINGE 5 MG/0.5 ML
5.0000 mg | INJECTION | INTRAMUSCULAR | Status: DC | PRN
  Filled 2013-02-27: qty 1

## 2013-02-27 MED ORDER — DIBUCAINE 1 % RE OINT: 1.0000 "application " | TOPICAL_OINTMENT | RECTAL | Status: DC | PRN

## 2013-02-27 MED ORDER — BUPIVACAINE HCL (PF) 0.25 % IJ SOLN
INTRAMUSCULAR | Status: DC | PRN
  Administered 2013-02-27: 6 mL

## 2013-02-27 MED ORDER — MEPERIDINE HCL 25 MG/ML IJ SOLN: 6.2500 mg | INTRAMUSCULAR | Status: DC | PRN

## 2013-02-27 MED ORDER — KETOROLAC TROMETHAMINE 30 MG/ML IJ SOLN
30.0000 mg | Freq: Four times a day (QID) | INTRAMUSCULAR | Status: AC | PRN
  Administered 2013-02-27: 30 mg via INTRAMUSCULAR

## 2013-02-27 MED ORDER — NALOXONE HCL 1 MG/ML IJ SOLN
1.0000 ug/kg/h | INTRAMUSCULAR | Status: DC | PRN
  Filled 2013-02-27: qty 2

## 2013-02-27 MED ORDER — WITCH HAZEL-GLYCERIN EX PADS: 1.0000 "application " | MEDICATED_PAD | CUTANEOUS | Status: DC | PRN

## 2013-02-27 MED ORDER — DIPHENHYDRAMINE HCL 25 MG PO CAPS
25.0000 mg | ORAL_CAPSULE | Freq: Four times a day (QID) | ORAL | Status: DC | PRN
  Filled 2013-02-27: qty 1

## 2013-02-27 MED ORDER — ZOLPIDEM TARTRATE 5 MG PO TABS: 5.0000 mg | ORAL_TABLET | Freq: Every evening | ORAL | Status: DC | PRN

## 2013-02-27 MED ORDER — FENTANYL CITRATE 0.05 MG/ML IJ SOLN
INTRAMUSCULAR | Status: DC | PRN
  Administered 2013-02-27: 75 ug via INTRAVENOUS
  Administered 2013-02-27: 25 ug via INTRATHECAL

## 2013-02-27 MED ORDER — MENTHOL 3 MG MT LOZG: 1.0000 | LOZENGE | OROMUCOSAL | Status: DC | PRN

## 2013-02-27 MED ORDER — ONDANSETRON HCL 4 MG/2ML IJ SOLN
INTRAMUSCULAR | Status: AC
  Filled 2013-02-27: qty 2

## 2013-02-27 MED ORDER — CEFAZOLIN SODIUM-DEXTROSE 2-3 GM-% IV SOLR
INTRAVENOUS | Status: DC | PRN
  Administered 2013-02-27: 2 g via INTRAVENOUS

## 2013-02-27 MED ORDER — SENNOSIDES-DOCUSATE SODIUM 8.6-50 MG PO TABS
2.0000 | ORAL_TABLET | Freq: Every day | ORAL | Status: DC
  Administered 2013-02-27 – 2013-03-03 (×4): 2 via ORAL

## 2013-02-27 MED ORDER — PRENATAL MULTIVITAMIN CH
1.0000 | ORAL_TABLET | Freq: Every day | ORAL | Status: DC
  Administered 2013-02-28 – 2013-03-03 (×4): 1 via ORAL
  Filled 2013-02-27 (×4): qty 1

## 2013-02-27 MED ORDER — POLYSACCHARIDE IRON COMPLEX 150 MG PO CAPS
150.0000 mg | ORAL_CAPSULE | Freq: Two times a day (BID) | ORAL | Status: DC
  Administered 2013-02-27 – 2013-03-03 (×7): 150 mg via ORAL
  Filled 2013-02-27 (×11): qty 1

## 2013-02-27 MED ORDER — LACTATED RINGERS IV SOLN
INTRAVENOUS | Status: DC | PRN
  Administered 2013-02-27: 10:00:00 via INTRAVENOUS

## 2013-02-27 MED ORDER — ONDANSETRON HCL 4 MG PO TABS: 4.0000 mg | ORAL_TABLET | ORAL | Status: DC | PRN

## 2013-02-27 MED ORDER — ONDANSETRON HCL 4 MG/2ML IJ SOLN: 4.0000 mg | Freq: Three times a day (TID) | INTRAMUSCULAR | Status: DC | PRN

## 2013-02-27 MED ORDER — METHYLERGONOVINE MALEATE 0.2 MG/ML IJ SOLN: 0.2000 mg | INTRAMUSCULAR | Status: DC | PRN

## 2013-02-27 MED ORDER — ONDANSETRON HCL 4 MG/2ML IJ SOLN
INTRAMUSCULAR | Status: DC | PRN
  Administered 2013-02-27: 4 mg via INTRAVENOUS

## 2013-02-27 MED ORDER — BUPIVACAINE IN DEXTROSE 0.75-8.25 % IT SOLN
INTRATHECAL | Status: DC | PRN
  Administered 2013-02-27: 1.6 mL via INTRATHECAL

## 2013-02-27 MED ORDER — CITRIC ACID-SODIUM CITRATE 334-500 MG/5ML PO SOLN
ORAL | Status: AC
  Administered 2013-02-27: 30 mL
  Filled 2013-02-27: qty 15

## 2013-02-27 MED ORDER — DIPHENHYDRAMINE HCL 50 MG/ML IJ SOLN: 12.5000 mg | INTRAMUSCULAR | Status: DC | PRN

## 2013-02-27 MED ORDER — METOCLOPRAMIDE HCL 5 MG/ML IJ SOLN: 10.0000 mg | Freq: Three times a day (TID) | INTRAMUSCULAR | Status: DC | PRN

## 2013-02-27 MED ORDER — BUPIVACAINE HCL (PF) 0.25 % IJ SOLN
INTRAMUSCULAR | Status: AC
  Filled 2013-02-27: qty 30

## 2013-02-27 MED ORDER — KETOROLAC TROMETHAMINE 30 MG/ML IJ SOLN
30.0000 mg | Freq: Four times a day (QID) | INTRAMUSCULAR | Status: AC | PRN
  Administered 2013-02-27: 30 mg via INTRAVENOUS
  Filled 2013-02-27: qty 1

## 2013-02-27 MED ORDER — 0.9 % SODIUM CHLORIDE (POUR BTL) OPTIME
TOPICAL | Status: DC | PRN
  Administered 2013-02-27: 1000 mL

## 2013-02-27 MED ORDER — METHYLERGONOVINE MALEATE 0.2 MG PO TABS: 0.2000 mg | ORAL_TABLET | ORAL | Status: DC | PRN

## 2013-02-27 MED ORDER — NALBUPHINE HCL 10 MG/ML IJ SOLN
5.0000 mg | INTRAMUSCULAR | Status: DC | PRN
  Filled 2013-02-27: qty 1

## 2013-02-27 MED ORDER — MORPHINE SULFATE (PF) 0.5 MG/ML IJ SOLN
INTRAMUSCULAR | Status: DC | PRN
  Administered 2013-02-27: .2 mg via EPIDURAL

## 2013-02-27 MED ORDER — NALOXONE HCL 0.4 MG/ML IJ SOLN: 0.4000 mg | INTRAMUSCULAR | Status: DC | PRN

## 2013-02-27 MED ORDER — OXYTOCIN 10 UNIT/ML IJ SOLN
40.0000 [IU] | INTRAVENOUS | Status: DC | PRN
  Administered 2013-02-27: 40 [IU] via INTRAVENOUS

## 2013-02-27 MED ORDER — OXYTOCIN 40 UNITS IN LACTATED RINGERS INFUSION - SIMPLE MED: 62.5000 mL/h | INTRAVENOUS | Status: AC

## 2013-02-27 MED ORDER — FERROUS SULFATE 325 (65 FE) MG PO TABS
325.0000 mg | ORAL_TABLET | Freq: Two times a day (BID) | ORAL | Status: DC
  Administered 2013-02-28 – 2013-03-03 (×7): 325 mg via ORAL
  Filled 2013-02-27 (×7): qty 1

## 2013-02-27 MED ORDER — BISACODYL 10 MG RE SUPP: 10.0000 mg | Freq: Every day | RECTAL | Status: DC | PRN

## 2013-02-27 MED ORDER — MORPHINE SULFATE 0.5 MG/ML IJ SOLN
INTRAMUSCULAR | Status: AC
  Filled 2013-02-27: qty 10

## 2013-02-27 MED ORDER — MEPERIDINE HCL 25 MG/ML IJ SOLN
INTRAMUSCULAR | Status: DC | PRN
  Administered 2013-02-27 (×2): 12.5 mg via INTRAVENOUS

## 2013-02-27 MED ORDER — OXYTOCIN 10 UNIT/ML IJ SOLN
INTRAMUSCULAR | Status: AC
  Filled 2013-02-27: qty 4

## 2013-02-27 MED ORDER — KETOROLAC TROMETHAMINE 30 MG/ML IJ SOLN
INTRAMUSCULAR | Status: AC
  Filled 2013-02-27: qty 1

## 2013-02-27 MED ORDER — LANOLIN HYDROUS EX OINT: 1.0000 "application " | TOPICAL_OINTMENT | CUTANEOUS | Status: DC | PRN

## 2013-02-27 MED ORDER — IBUPROFEN 600 MG PO TABS
600.0000 mg | ORAL_TABLET | Freq: Four times a day (QID) | ORAL | Status: DC
  Administered 2013-02-28 – 2013-03-01 (×8): 600 mg via ORAL
  Filled 2013-02-27 (×8): qty 1

## 2013-02-27 MED ORDER — SODIUM CHLORIDE 0.9 % IV SOLN: 250.0000 mL | INTRAVENOUS | Status: DC

## 2013-02-27 MED ORDER — SODIUM CHLORIDE 0.9 % IJ SOLN
3.0000 mL | Freq: Two times a day (BID) | INTRAMUSCULAR | Status: DC
  Administered 2013-02-28: 3 mL via INTRAVENOUS

## 2013-02-27 SURGICAL SUPPLY — 47 items
APL SKNCLS STERI-STRIP NONHPOA (GAUZE/BANDAGES/DRESSINGS)
BARRIER ADHS 3X4 INTERCEED (GAUZE/BANDAGES/DRESSINGS) ×2 IMPLANT
BENZOIN TINCTURE PRP APPL 2/3 (GAUZE/BANDAGES/DRESSINGS) IMPLANT
BRR ADH 4X3 ABS CNTRL BYND (GAUZE/BANDAGES/DRESSINGS) ×1
CLAMP CORD UMBIL (MISCELLANEOUS) IMPLANT
CLOTH BEACON ORANGE TIMEOUT ST (SAFETY) ×2 IMPLANT
CONTAINER PREFILL 10% NBF 15ML (MISCELLANEOUS) IMPLANT
DRAPE LG THREE QUARTER DISP (DRAPES) ×2 IMPLANT
DRSG OPSITE POSTOP 4X10 (GAUZE/BANDAGES/DRESSINGS) ×2 IMPLANT
DURAPREP 26ML APPLICATOR (WOUND CARE) ×2 IMPLANT
ELECT REM PT RETURN 9FT ADLT (ELECTROSURGICAL) ×2
ELECTRODE REM PT RTRN 9FT ADLT (ELECTROSURGICAL) ×1 IMPLANT
EXTRACTOR VACUUM M CUP 4 TUBE (SUCTIONS) IMPLANT
GLOVE BIO SURGEON STRL SZ 6.5 (GLOVE) ×3 IMPLANT
GLOVE BIOGEL PI IND STRL 7.0 (GLOVE) ×1 IMPLANT
GLOVE BIOGEL PI INDICATOR 7.0 (GLOVE) ×3
GOWN STRL REIN XL XLG (GOWN DISPOSABLE) ×4 IMPLANT
KIT ABG SYR 3ML LUER SLIP (SYRINGE) ×2 IMPLANT
NDL HYPO 18GX1.5 BLUNT FILL (NEEDLE) IMPLANT
NDL HYPO 25X1 1.5 SAFETY (NEEDLE) ×1 IMPLANT
NDL HYPO 25X5/8 SAFETYGLIDE (NEEDLE) IMPLANT
NEEDLE HYPO 18GX1.5 BLUNT FILL (NEEDLE) ×2 IMPLANT
NEEDLE HYPO 25X1 1.5 SAFETY (NEEDLE) ×2 IMPLANT
NEEDLE HYPO 25X5/8 SAFETYGLIDE (NEEDLE) ×4 IMPLANT
NS IRRIG 1000ML POUR BTL (IV SOLUTION) ×2 IMPLANT
PACK C SECTION WH (CUSTOM PROCEDURE TRAY) ×2 IMPLANT
PAD OB MATERNITY 4.3X12.25 (PERSONAL CARE ITEMS) ×2 IMPLANT
RTRCTR C-SECT PINK 25CM LRG (MISCELLANEOUS) IMPLANT
STAPLER VISISTAT 35W (STAPLE) ×1 IMPLANT
STRIP CLOSURE SKIN 1/2X4 (GAUZE/BANDAGES/DRESSINGS) IMPLANT
SUT CHROMIC GUT AB #0 18 (SUTURE) IMPLANT
SUT MNCRL 0 VIOLET CTX 36 (SUTURE) ×3 IMPLANT
SUT MON AB 4-0 PS1 27 (SUTURE) IMPLANT
SUT MONOCRYL 0 CTX 36 (SUTURE) ×3
SUT PLAIN 2 0 (SUTURE) ×2
SUT PLAIN 2 0 XLH (SUTURE) IMPLANT
SUT PLAIN ABS 2-0 CT1 27XMFL (SUTURE) IMPLANT
SUT VIC AB 0 CT1 27 (SUTURE) ×4
SUT VIC AB 0 CT1 27XBRD ANBCTR (SUTURE) ×2 IMPLANT
SUT VIC AB 2-0 CT1 27 (SUTURE) ×2
SUT VIC AB 2-0 CT1 TAPERPNT 27 (SUTURE) ×1 IMPLANT
SYR BULB 3OZ (MISCELLANEOUS) ×1 IMPLANT
SYR CONTROL 10ML LL (SYRINGE) ×2 IMPLANT
SYRINGE 10CC LL (SYRINGE) ×1 IMPLANT
TOWEL OR 17X24 6PK STRL BLUE (TOWEL DISPOSABLE) ×2 IMPLANT
TRAY FOLEY CATH 14FR (SET/KITS/TRAYS/PACK) ×1 IMPLANT
WATER STERILE IRR 1000ML POUR (IV SOLUTION) ×1 IMPLANT

## 2013-02-27 NOTE — Transfer of Care (Signed)
Immediate Anesthesia Transfer of Care Note  Patient: Felicia [name]  Procedure(s) Performed: Procedure(s): CESAREAN SECTION (N/A)  Patient Location: PACU  Anesthesia Type:Spinal  Level of Consciousness: awake  Airway & Oxygen Therapy: Patient Spontanous Breathing  Post-op Assessment: Report given to PACU RN and Post -op Vital signs reviewed and stable  Post vital signs: stable  Complications: No apparent anesthesia complications

## 2013-02-27 NOTE — Lactation Note (Signed)
This note was copied from the chart of Felicia Jayonna [name]. Lactation Consultation Note   Initial consult with this mom of twins, now 2 1/2 hours post partum. They are 33 1/[redacted] weeks gestation. Mom has been on antenatal for about 2 weeks I set up a DEP and instructed mom in it;s use, in premie setting, and showed her how to hand express. She was able tot express about 1-2 mls of colostrum - it was a little bloody from each breast. I showed mom how to hand express, and she gave a good return demonstration. i briefly did teaching on providing breast milk for a NICU baby, and told mom I will do more tomorrow, when she is feeling better. I encouraged mom to do skin to skin with her babies, as son as possible. Dad very involved and helpful.  Patient Name: Felicia [name] Today's Date: 02/27/2013     Maternal Data    Feeding    LATCH Score/Interventions                      Lactation Tools Discussed/Used     Consult Status      Calypso Hagarty Anne 02/27/2013, 5:14 PM    

## 2013-02-27 NOTE — Progress Notes (Signed)
Felicia [name]  was seen today for an ultrasound appointment.  See full report in [facility].  Impression: Dichorionic/diamniotic twin pregnancy at 33+1 weeks Fetal growth restriction of twin B  Twin A:  Maternal left, cephalic, posterior placenta Estimated fetal weight at the 51st %tile (439 g weight gain over last 14 days) BPP 6/8 (-2 for absent breathing movement) Elevated UA Dopplers for gestational age Normal amniotic fluid volume  Twin B:  Maternal right, breech, posterior placenta Estimated fetal weight < 10th %tile with all biometric measurements < 5th %tile.  (250 g weight gain over last 14 days) BPP 8/8 Mostly reversed diastolic flow on UA Dopplers.  Some absent diastolic flow. Normal ductus venosus wave form Normal amniotic fluid volume  Recommendations: Recommend delivery based on mostly reversed diastolic flow on UA Dopplers. Dr. Cherly Hensen notified of findings.  Alpha Gula, MD

## 2013-02-27 NOTE — Anesthesia Postprocedure Evaluation (Signed)
  Anesthesia Post-op Note  Patient: Felicia [name]  Procedure(s) Performed: Procedure(s): CESAREAN SECTION (N/A)  Patient Location: PACU and Women's Unit  Anesthesia Type:Spinal  Level of Consciousness: awake, alert  and oriented  Airway and Oxygen Therapy: Patient Spontanous Breathing  Post-op Pain: mild  Post-op Assessment: Patient's Cardiovascular Status Stable, Respiratory Function Stable, Patent Airway and Pain level controlled  Post-op Vital Signs: Reviewed and stable  Complications: No apparent anesthesia complications

## 2013-02-27 NOTE — Progress Notes (Signed)
S: 33 1/7 weeks twins  O: sono done this: twins vtx/breech.  2nd twin reverse flow  ( A) BPP 6/8 (B) BPP 8/8  Nl flow  IMP: Twins: malpresentation 2nd twin w/ reverse flow Twin B for delivery As disc with MFM Class A1 GDM  P) Primary C/S  Procedure explained. Risk of surgery reviewed w/ pt and husband: infection, bleeding, injury to bladder, bowel, ureter, internal scar tissue. Poss need for blood transfusion and its risk disc. All ? Answered. Consent signed

## 2013-02-27 NOTE — Anesthesia Procedure Notes (Signed)
Spinal  Patient location during procedure: OR Start time: 02/27/2013 9:14 AM Staffing Anesthesiologist: Tanor Glaspy A. Performed by: anesthesiologist  Preanesthetic Checklist Completed: patient identified, site marked, surgical consent, pre-op evaluation, timeout performed, IV checked, risks and benefits discussed and monitors and equipment checked Spinal Block Patient position: sitting Prep: site prepped and draped and DuraPrep Patient monitoring: heart rate, cardiac monitor, continuous pulse ox and blood pressure Approach: midline Location: L4-5 Injection technique: single-shot Needle Needle type: Sprotte  Needle gauge: 24 G Needle length: 9 cm Needle insertion depth: 5 cm Assessment Sensory level: T6 Additional Notes Patient tolerated procedure well. Adequate sensory level.

## 2013-02-27 NOTE — Anesthesia Preprocedure Evaluation (Signed)
Anesthesia Evaluation  Patient identified by MRN, date of birth, ID band Patient awake    Reviewed: Allergy & Precautions, H&P , Patient's Chart, lab work & pertinent test results  Airway Mallampati: II TM Distance: >3 FB Neck ROM: Full    Dental no notable dental hx. (+) Teeth Intact   Pulmonary neg pulmonary ROS,  breath sounds clear to auscultation- rhonchi  Pulmonary exam normal       Cardiovascular negative cardio ROS  Rhythm:Regular Rate:Normal     Neuro/Psych negative neurological ROS  negative psych ROS   GI/Hepatic Neg liver ROS, GERD-  ,  Endo/Other  negative endocrine ROS  Renal/GU negative Renal ROS  negative genitourinary   Musculoskeletal negative musculoskeletal ROS (+)   Abdominal   Peds  Hematology negative hematology ROS (+)   Anesthesia Other Findings   Reproductive/Obstetrics (+) Pregnancy Twin Gestation 33 weeks Discordant growth Reverse flow dopplers                           Anesthesia Physical Anesthesia Plan  ASA: II and emergent  Anesthesia Plan: Spinal   Post-op Pain Management:    Induction:   Airway Management Planned: Natural Airway  Additional Equipment:   Intra-op Plan:   Post-operative Plan:   Informed Consent: I have reviewed the patients History and Physical, chart, labs and discussed the procedure including the risks, benefits and alternatives for the proposed anesthesia with the patient or authorized representative who has indicated his/her understanding and acceptance.     Plan Discussed with: CRNA, Anesthesiologist and Surgeon  Anesthesia Plan Comments:         Anesthesia Quick Evaluation

## 2013-02-27 NOTE — Op Note (Signed)
Felicia [name], Felicia [name]                 ACCOUNT NO.:  [id number]  MEDICAL RECORD NO.:  [id number]  LOCATION:  MFM                           FACILITY:  [facility]  PHYSICIAN:  Maxie Better, M.D.DATE OF BIRTH:  1981/01/06  DATE OF PROCEDURE:  02/27/2013 DATE OF DISCHARGE:                              OPERATIVE REPORT   PREOPERATIVE DIAGNOSIS:  Twin gestation at 33-1/7th weeks, malpresentation breech 2nd twin, reversed fetal Doppler twin B.  PROCEDURES:  Primary cesarean section, Kerr hysterotomy.  POSTOPERATIVE DIAGNOSIS:  Vertex/ transverse presentation twin gestation at 33-1/7th weeks, reversed Dopplers on twin B.  ANESTHESIA:  Spinal.  SURGEON:  Maxie Better, M.D.  ASSISTANT:  Marlinda Mike, CNM.  DESCRIPTION OF PROCEDURE:  Under adequate spinal anesthesia, the patient was placed in the supine position with a left lateral tilt.  She was sterilely prepped and draped in usual fashion.  An indwelling Foley catheter was sterilely placed.  An 0.25% Marcaine was injected along the Pfannenstiel skin incision site.  Pfannenstiel skin incision was then made, carried down to the rectus fascia.  Rectus fascia was opened transversely.  The rectus fascia then bluntly and sharply dissected off the rectus muscle in superior-inferior fashion.  The rectus muscle was split in midline.  The parietal peritoneum was entered sharply.  The vesicouterine peritoneum was opened transversely.  The bladder was bluntly dissected off the lower uterine segment and displaced inferiorly with a bladder retractor.  A curvilinear low transverse uterine incision was then made and extended with bandage scissors.  Clear amniotic fluid sac noted.  Artificial rupture of membranes was performed.  Floating vertex was noted.  Subsequent delivery of a live female from left occiput posterior presentation was accomplished.  The baby was bulb suctioned on the abdomen.  Cord was clamped, cut.  The baby was transferred to  the awaiting pediatrician.  Second baby was palpated through intact membrane with Transverse lie foot down.  The feet were identified through the intact bag. Artificial rupture of membranes was then performed and subsequent delivery with footling breech of a live female was accomplished using the usual breech maneuvers.  The cord was clamped, cut.  The baby was transferred to the awaiting pediatrician as well.  The placenta x2 was manually removed and sent to Pathology.  Uterine cavity was cleaned of debris.  Uterine incision had no extension.  Apgars on twin A was 8 and 9, and on twin B, Apgars were 8 and 9 as well.  His weight was 3 pounds 0.7 ounce.  Her weight was 4 pounds 10 ounces.  The uterine incision was closed in 2 layers, the first layer with 0 Monocryl running locked stitch, second layer was imbricated using 0 Monocryl suture.  Normal tubes and ovaries were noted bilaterally.  The abdomen was copiously irrigated and suctioned of debris.  Interceed was placed overlying the lower uterine segment.  The parietal peritoneum was then closed with 2-0 Vicryl.  The rectus fascia was closed with 0 Vicryl x2.  The subcutaneous area was irrigated, small bleeders cauterized.  Interrupted 2-0 plain sutures placed and the skin approximated with Ethicon staples.  SPECIMENS:  Placenta x2 sent to Pathology.  Cord pH  was obtained x2. The patient's intraoperative fluid was 1 L.  URINE OUTPUT:  100 mL.  ESTIMATED BLOOD LOSS:  600 mL.  Sponge and instrument counts x2 was correct.  COMPLICATION:  None.  The patient tolerated the procedure well, was transferred to recovery in stable condition.  Both babies went to the neonatal intensive care unit due to prematurity.     Maxie Better, M.D.     /MEDQ  D:  02/27/2013  T:  02/27/2013  Job:  [id number]

## 2013-02-27 NOTE — Brief Op Note (Signed)
02/09/2013 - 02/27/2013  9:58 AM  PATIENT:  Felicia [name]  32 y.o. female  PRE-OPERATIVE DIAGNOSIS:  malpresentation second twin, reverse dopplers( twin B), Twin gestation @ 30 1/7 weeks  POST-OPERATIVE DIAGNOSIS:  mal presentation second twin /reverse dopplers (twin B), Twin gestation @ 33 1/7 weeks  PROCEDURE:  Primary cesarean section, Sharl Ma hysterotomy  SURGEON:  Surgeon(s) and Role:    * Shameer Molstad Cathie Beams, MD - Primary  PHYSICIAN ASSISTANT:   ASSISTANTS: Marlinda Mike, CNM   ANESTHESIA:   spinal  Findings: twin A, vtx live female 4lb 10 oz, Apgar 8/9 Twin B female 3lb Apgar 8/9 transverse back up Nl tubes and ovaries  EBL:  Total I/O In: 1000 [I.V.:1000] Out: 700 [Urine:100; Blood:600]  BLOOD ADMINISTERED:none  DRAINS: none   LOCAL MEDICATIONS USED:  MARCAINE     SPECIMEN:  Source of Specimen:  Placenta x 2  DISPOSITION OF SPECIMEN:  PATHOLOGY  COUNTS:  YES  TOURNIQUET:  * No tourniquets in log *  DICTATION: .Other Dictation: Dictation Number [id number]  PLAN OF CARE: Admit to inpatient   PATIENT DISPOSITION:  PACU - hemodynamically stable.   Delay start of Pharmacological VTE agent (>24hrs) due to surgical blood loss or risk of bleeding: no

## 2013-02-27 NOTE — Consult Note (Signed)
Neonatology Note:   Attendance at C-section:    I was asked by Dr. Cherly Hensen to attend this primary C/S at 33 weeks due to REDF of Twin B. The mother is a G3P0A2 A pos, GBS neg with diet-controlled GDM and twin gestation (Di-Di). She was admitted on 8/7 due to discordancy and abnormal Doppler studies and received Betamethasone times 2 doses and neuroprotective magnesium sulfate. The babies have been followed with Doppler and watching their growth since then. ROM at delivery, fluid clear.  Twin A, a girl, delivered vertex and was vigorous with good spontaneous cry and tone. Needed only minimal bulb suctioning. By about 5 min, she had noticeable sternal retractions and little air exchange and pulse oximetry showed O2 saturations of 69% in room air. The neopuff was placed on her with good response, O2 saturations up to normal and less retractions. Ap 8/9. Lungs clear to ausc in DR. Viewed briefly by her mother, then transported to the NICU on the neopuff.  Twin B, a boy, delivered footling breech and was also vigorous with good tone and spontaneous cry. He was bulb suctioned for increased secretions. By 4 minuts, he was not pinked up and a pulse oximeter was placed, showing O2 saturations in the low 70s, so BBO2 was started with normalization of the O2 saturations. By 6-7 minutes, he was starting to retrac and air exchange was diminished, so the neopuff was placed on him. His O2 saturations remained in the mid-90s on blended O2. Ap 8/9. He was viewed by his parents briefly, then transported to the NICU on the neopuff with his father in attendance.   Doretha Sou, MD

## 2013-02-27 NOTE — Anesthesia Postprocedure Evaluation (Signed)
Anesthesia Post Note  Patient: Felicia [name]  Procedure(s) Performed: Procedure(s) (LRB): CESAREAN SECTION (N/A)  Anesthesia type: Spinal  Patient location: PACU  Post pain: Pain level controlled  Post assessment: Post-op Vital signs reviewed  Last Vitals:  Filed Vitals:   02/27/13 1015  BP: 113/68  Pulse: 73  Temp:   Resp: 19    Post vital signs: Reviewed  Level of consciousness: awake  Complications: No apparent anesthesia complications

## 2013-02-28 ENCOUNTER — Encounter (HOSPITAL_COMMUNITY): Payer: Self-pay | Admitting: Obstetrics and Gynecology

## 2013-02-28 LAB — CBC
Hemoglobin: 10.4 g/dL — ABNORMAL LOW (ref 12.0–15.0)
MCV: 89.2 fL (ref 78.0–100.0)
Platelets: 127 10*3/uL — ABNORMAL LOW (ref 150–400)
RBC: 3.52 MIL/uL — ABNORMAL LOW (ref 3.87–5.11)
WBC: 7.2 10*3/uL (ref 4.0–10.5)

## 2013-02-28 NOTE — Lactation Note (Signed)
This note was copied from the chart of Felicia Danie [name]. Lactation Consultation Note   Follow up consult with this mom f twins, 33 2/7 weeks corrected gestation, and 28 hours old. Mom spent all morning doing skin to skin with her babies. i assisted her with pumping, and increased her to 27 flanges. Mom reports this feeling much better. They appeared to be better fit. Mom was not able to express colostrum today, and hand expression was very painful for her, so I only tried a few times. i told mom it is normal to get colostrum first day, and not second, but will express colostrum with milk on day 3. I encouraged her to keep pumping every 3 hours. I will follow this family in the NICU  Patient Name: Felicia [name] Today's Date: 02/28/2013 Reason for consult: Follow-up assessment;NICU baby;Multiple gestation   Maternal Data    Feeding    LATCH Score/Interventions                      Lactation Tools Discussed/Used     Consult Status Consult Status: Follow-up Date: 03/01/13 Follow-up type: In-patient    Amellia Panik Anne 02/28/2013, 3:31 PM    

## 2013-02-28 NOTE — Progress Notes (Signed)
02/28/13 1600  Clinical Encounter Type  Visited With Patient  Visit Type Follow-up;Spiritual support;Social support  Spiritual Encounters  Spiritual Needs Brewing technologist was in good spirits as she held baby Darl Pikes on her chest in the NICU.  She shared about her desire to be with her babies all the time, and we talked about the importance of self-care, too, so that she can stay well and continue to visit them regularly.  Provided pastoral presence, reflective listening, and encouragement.   We will continue to follow, but please also page as needed:  [phone].  Thank you.  [address] [place], [place] [phone]

## 2013-02-28 NOTE — Progress Notes (Signed)
POD # 1  Subjective: Pt reports feeling good, a little sore, some burning at incision site/ Pain controlled with Motrin and Percocet Tolerating po/Voiding without problems/ No n/v/Flatus burping Activity: ad lib Bleeding is light Newborn info:  Information for the patient's newborn:  Parminder, Trapani [[id number]]  female Information for the patient's newborn:  Sherae, Santino [[id number]]  female Circumcision: babies in NICU/ Feeding: breast and pumping   Objective: VS: Temp:  [97.5 F (36.4 C)-98.4 F (36.9 C)] 97.5 F (36.4 C) (08/26 0528) Pulse Rate:  [53-87] 87 (08/26 0935) Resp:  [14-23] 16 (08/26 0935) BP: (89-120)/(60-79) 115/79 mmHg (08/26 0935) SpO2:  [95 %-100 %] 100 % (08/26 0935)  I&O: Intake/Output     08/25 0701 - 08/26 0700 08/26 0701 - 08/27 0700   P.O. 1830    I.V. (mL/kg) 4186.6 (56.3)    Total Intake(mL/kg) 6016.6 (80.9)    Urine (mL/kg/hr) 4250 (2.4)    Blood 600 (0.3)    Total Output 4850     Net +1166.6              Recent Labs  02/27/13 0850 02/28/13 0525  WBC 7.5 7.2  HGB 12.7 10.4*  HCT 36.9 31.4*  PLT 125* 127*    Blood type: --/--/A POS (08/24 2245) Rubella:   Immune   Physical Exam:  General: alert and cooperative CV: Regular rate and rhythm Resp: clear Abdomen: soft, nontender, normal bowel sounds Incision: healing well, no drainage, no erythema, no swelling, honeycomb dressing c/d/i Uterine Fundus: firm, below umbilicus, nontender Lochia: minimal Ext: extremities normal, atraumatic, no cyanosis or edema and Homans sign is negative, no sign of DVT    Assessment: POD # 1/ G3P0122 S/P C/Section d/t reverse doppler flow twin B Thrombocytopenia-stable Doing well  Plan: Ambulate Continue routine post op orders   Signed: Donette Larry, Dorris Carnes, MSN, CNM 02/28/2013, 9:47 AM

## 2013-02-28 NOTE — Progress Notes (Signed)
Parents were visiting with the twins when CSW came to assess.  CSW will return at a later time.

## 2013-03-01 ENCOUNTER — Other Ambulatory Visit (HOSPITAL_COMMUNITY): Payer: No Typology Code available for payment source

## 2013-03-01 MED ORDER — IBUPROFEN 800 MG PO TABS
800.0000 mg | ORAL_TABLET | Freq: Three times a day (TID) | ORAL | Status: DC
  Administered 2013-03-01 – 2013-03-03 (×7): 800 mg via ORAL
  Filled 2013-03-01 (×7): qty 1

## 2013-03-01 NOTE — Lactation Note (Signed)
This note was copied from the chart of [name]. Lactation Consultation Note    Follow up consult with this mom of NICU twins, now 57 hours  Post partum. Mom reports her milk volume is increasing to ablut 30 mls every 3 hours. She has a DEP at home, and I gave her a new pump kit for the pump. Mom was also c/o sore nipples. i advised her not to raise her suction beyond a comfortable level, and also gave her comfort gels , and instructed her in it's use. i will follow this family in the NICU. Mom is ready to nuzzle with her babbies, who are 33 3/[redacted] weeks gestation today.  Patient Name: [name] Today's Date: 03/01/2013 Reason for consult: Follow-up assessment;NICU baby;Multiple gestation   Maternal Data    Feeding Feeding Type: Breast Milk Length of feed: 30 min  LATCH Score/Interventions                      Lactation Tools Discussed/Used     Consult Status Consult Status: Follow-up Date: 03/02/13 Follow-up type: In-patient    Felicia [name] 03/01/2013, 7:15 PM    

## 2013-03-01 NOTE — Progress Notes (Addendum)
POSTOPERATIVE DAY # 2 S/P CS - twins in NICU  Here to see patient for rounds - patient not in room / in NICU   VS: BP 108/59  Pulse 81  Temp(Src) 98 F (36.7 C) (Oral)  Resp 16  Ht 5\' 6"  (1.676 m)  Wt 164 lb (74.39 kg)  BMI 26.48 kg/m2  SpO2 99%  LMP 06/05/2012   LABS:               Recent Labs  02/27/13 0850 02/28/13 0525  WBC 7.5 7.2  HGB 12.7 10.4*  PLT 125* 127*               Bloodtype: --/--/A POS (08/24 2245)  Rubella:                                    Marlinda Mike CNM, MSN, FACNM 03/01/2013, 11:03 AM   1410 - patient not in room - will revisit later this PM for rounds  1755 - patient not in room - will revisit later / just went back to NICU / nurse to call upon patient return  2100 - patient not in room - nurse to call patient back at 2200 for medications / provider visit

## 2013-03-01 NOTE — Progress Notes (Signed)
POSTOPERATIVE DAY # 2 S/P cesarean section  S:         Reports feeling really tired and sore with really swollen feet tonight- been in NICU all day             Tolerating po intake / no nausea / no vomiting / + flatus / + BM             Bleeding is light             Pain controlled with motrin and percocet - but not helping burning and soreness             Up ad lib / ambulatory/ voiding QS  Newborns in NICU -  breast feeding planned - pumping     O:  VS: BP 108/59  Pulse 81  Temp(Src) 98 F (36.7 C) (Oral)  Resp 16  Ht 5\' 6"  (1.676 m)  Wt 74.39 kg (164 lb)  BMI 26.48 kg/m2  SpO2 99%  LMP 06/05/2012   LABS:               Recent Labs  02/27/13 0850 02/28/13 0525  WBC 7.5 7.2  HGB 12.7 10.4*  PLT 125* 127*               Bloodtype: --/--/A POS (08/24 2245)  Rubella:                                           Physical Exam:             Alert and Oriented X3  Abdomen: soft, non-tender, non-distended             Fundus: firm, non-tender, U-2             Dressing intact honeycomb              Incision:  approximated with staples / no erythema / no ecchymosis / no drainage  Extremities: 1+ edema, no calf pain or tenderness, negative Homans  A:        POD # 2 S/P CS - twins              P:        Routine postoperative care              Reviewed pain management - will increase motrin dose             Encouraged to rest mid-day to recover from surgery              - pedal edema and increased fatigue and soreness from over-extending sitting in NICU for over 12 hours today             Plan dc tomorrow     Marlinda Mike CNM, MSN, [facility] 03/01/2013, 10:06 PM

## 2013-03-01 NOTE — Progress Notes (Signed)
03/01/13 1500  Clinical Encounter Type  Visited With Patient (in NICU)  Visit Type Follow-up   Followed up with Felicia [name] today, who is loving being with her babies and succeeds well at staying calm and grounded as babies Baxter and Darl Pikes develop and move through different needs.  Provided pastoral listening, reflection, and encouragement.    [address] [place], [place] [phone]

## 2013-03-02 NOTE — Clinical Social Work Maternal (Signed)
    LATE ENTRY FROM 03/01/13:  Clinical Social Work Department PSYCHOSOCIAL ASSESSMENT - MATERNAL/CHILD 03/02/2013  Patient:  Felicia [name], Felicia [name]  Account Number:  [id number]  Admit Date:  02/09/2013  Marjo Bicker Name:   Felicia [name]    Clinical Social Worker:  Nobie Putnam, LCSW   Date/Time:  03/01/2013 11:40 AM  Date Referred:  03/01/2013   Referral source  NICU     Referred reason  NICU   Other referral source:    I:  FAMILY / HOME ENVIRONMENT Child's legal guardian:  PARENT  Guardian - Name Guardian - Age Guardian - Address  Felicia [name] 32 [address] [place], [place] [address]  Felicia [name]  (same as above)   Other household support members/support persons Other support:    II  PSYCHOSOCIAL DATA Information Source:  Patient Interview  Event organiser Employment:   Surveyor, quantity resources:  Media planner If [employer] - [place]:    School / Grade:   Maternity Care Coordinator / Child Services Coordination / Early Interventions:  Cultural issues impacting care:    III  STRENGTHS Strengths  Adequate Resources  Home prepared for Child (including basic supplies)  Supportive family/friends   Strength comment:    IV  RISK FACTORS AND CURRENT PROBLEMS Current Problem:  None   Risk Factor & Current Problem Patient Issue Family Issue Risk Factor / Current Problem Comment   N N     V  SOCIAL WORK ASSESSMENT CSW met with the parents briefly to offer resources & support as needed.  MOB was eating breakfast when CSW came to assess.  The parents were very pleasant & seemed happy, as they talked about their twins & adjusting to a 31 year old at home.  MOB denies any history of depression & they both are doing well emotionally.  The couple has all the necessary supplies for the twins & a host of family/friends.  They have selected Dr. Nash Dimmer as Pediatrician & plan to call her today to register the twins.  The parents seem to be doing well at this time. CSW  will follow & assist as needed.      VI SOCIAL WORK PLAN Social Work Plan  Psychosocial Support/Ongoing Assessment of Needs   Type of pt/family education:   If child protective services report - county:   If child protective services report - date:   Information/referral to community resources comment:   Other social work plan:

## 2013-03-02 NOTE — Progress Notes (Addendum)
Patient ID: Felicia [name], female   DOB: Nov 22, 1980, 32 y.o.   MRN: [id number] Subjective: POD# 3 Information for the patient's newborn:  Felicia, [name] [[id number]]  female Information for the patient's newborn:  Felicia, [name] [[id number]]  female  / circ baby in NICU  Reports feeling well with a little more soreness today Feeding: bottle - with pumped breast milk  Patient reports tolerating PO.  Breast symptoms: none Pain controlled with ibuprofen (OTC) and narcotic analgesics including Percocet Denies HA/SOB/C/P/N/V/dizziness. Flatus present, (+) BM. She reports vaginal bleeding as normal, without clots.  She is ambulating, urinating without difficult.     Objective:   VS:  Filed Vitals:   02/28/13 1805 02/28/13 2300 03/01/13 0525 03/02/13 0521  BP:   108/59 109/76  Pulse: 79 90 81 98  Temp: 97.8 F (36.6 C) 97.9 F (36.6 C) 98 F (36.7 C) 97.5 F (36.4 C)  TempSrc: Oral Oral Oral Oral  Resp: 16 18 16 16   Height:      Weight:      SpO2: 100% 95% 99% 99%       Recent Labs  02/28/13 0525  WBC 7.2  HGB 10.4*  HCT 31.4*  PLT 127*     Blood type: --/--/A POS (08/24 2245)  Rubella:       Physical Exam:  General: alert, cooperative and no distress CV: Regular rate and rhythm, S1S2 present or without murmur or extra heart sounds Resp: clear Abdomen: soft, nontender, normal bowel sounds Incision: Tegaderm and Honeycomb dressing intact Uterine Fundus: firm, below umbilicus, nontender Lochia: minimal Ext: extremities normal, atraumatic, no cyanosis, 1+ dependent edema.  Homans sign is negative, no sign of DVT      Assessment/Plan: 32 y.o.   POD# 3.  s/p Cesarean Delivery.  Indications: Di-Di Twins, malpresentation breech 2nd twin, reverse fetal Doppler twin B               Principal Problem:   Postpartum care following cesarean delivery (8/25) Active Problems:   Twin pregnancy, twins discordant in third trimester (a>b) 31 weeks   Dichorionic diamniotic  twin gestation  Doing well, stable.               Regular diet as tolerated Ambulate Increase po hydration  Encouraged to increase rest with legs elevated Routine post-op care Desires discharge home tomorrow - ok per Felicia [name] with Care Management  Felicia Gower, MSN, CNM 03/02/2013, 11:41 AM

## 2013-03-03 ENCOUNTER — Other Ambulatory Visit (HOSPITAL_COMMUNITY): Payer: No Typology Code available for payment source

## 2013-03-03 MED ORDER — OXYCODONE-ACETAMINOPHEN 5-325 MG PO TABS: 1.0000 | ORAL_TABLET | ORAL | Status: DC | PRN

## 2013-03-03 MED ORDER — IBUPROFEN 800 MG PO TABS: 800.0000 mg | ORAL_TABLET | Freq: Three times a day (TID) | ORAL | Status: DC

## 2013-03-03 NOTE — Progress Notes (Signed)
Staples removed, steri strips applied. Pt given prescriptions and discharge instructions. Husband escorted patient to nnicu before home.

## 2013-03-03 NOTE — Progress Notes (Signed)
Patient ID: Felicia [name], female   DOB: [birth year], 32 y.o.   MRN: [id number] POD # 4 S/P Primary C/S Twins 33wks  Subjective: Pt reports feeling well and "ok" for d/c home today/ Pain controlled with ibuprofen and percocet Tolerating po/Voiding without problems/ No n/v/Flatus pos and has had BM today Activity: out of bed and ambulate Bleeding is light Newborn info:  Information for the patient's newborn:  Willeen, Novak [[id number]]  female Information for the patient's newborn:  Niomi, Valent [[id number]]  female  Feeding: breast (Pumping)   Objective: VS: BP 106/66  Pulse 77  Temp(Src) 97.5 F (36.4 C) (Oral)  Resp 18  Ht 5\' 6"  (1.676 m)  Wt 74.39 kg (164 lb)  BMI 26.48 kg/m2  SpO2 98%  LMP 06/05/2012                            Physical Exam:  General: alert, cooperative and no distress CV: Regular rate and rhythm Resp: clear Abdomen: soft, nontender, normal bowel sounds Incision: Covered with Tegaderm and honeycomb dressing; well approximated.  Uterine Fundus: firm, below umbilicus, nontender Lochia: minimal Ext: edema +1 and Homans sign is negative, no sign of DVT    A/P: POD # 4 G3P0122/ S/P C/Section d/t Breech and reverse doppler, Twins 33wks Doing well and stable for discharge home Remove tegaderm and staples prior to d/c home RX's: Ibuprofen 800mg  po Q 6 hrs prn pain #30 Refill x 1 Percocet 5/325 1 - 2 tabs po every 6 hrs prn pain  #30 No refill Niferex 150mg  po QD/BID #30/#60 Refill x 1 Rt pp visit in 6 wks    Signed: Demetrius Revel, MSN, [facility] 03/03/2013, 4:12 PM

## 2013-03-03 NOTE — Discharge Summary (Signed)
Obstetric Discharge Summary Reason for Admission: G3 P0 0 2 0 admitted at 30 4/7 wks; Twin gestation with reverse/absent  doppler flow Twin B; GDM; discordant growth, Anemia Prenatal Procedures: NST/dopplers and ultrasound.BMZ Intrapartum Procedures: cesarean: low cervical, transverse Postpartum Procedures: none Complications-Operative and Postpartum: none Hemoglobin  Date Value Range Status  02/28/2013 10.4* 12.0 - 15.0 g/dL Final     DELTA CHECK NOTED     REPEATED TO VERIFY     HCT  Date Value Range Status  02/28/2013 31.4* 36.0 - 46.0 % Final    Physical Exam:  General: alert, cooperative and no distress Lochia: appropriate Uterine Fundus: firm Incision: healing well and well approximated DVT Evaluation: No evidence of DVT seen on physical exam.  Hospital course: Pt was admitted at 30 4/7 weeks for close fetal monitoring due to some reverse/absent doppler flow on twin B.  These were twin gestation with known fetal growth discrepancy who was previously admitted and discharged. Pt was followed by MFM with biweekly fetal surveillance. On admission , pt was given BMZ. She was seen by MFM and Magnesium sulfate for CP prophylaxis was done. GBS cx done. Pt had been diagnosed with GDM. Blood sugar testing was done.  Pt remained inpt and underwent frequent/daily doppler studies and repeat fetal growth. When reverse doppler was more prominent on twin B, decision made to proceed with delivery. Twin B was breech. Primary C/s was done. See op note for details. Postop course was unremarkable Discharge Diagnoses: S/P Primary C/S @ 33wks, d/t Twin A Breech and Twin B with reverse doppler flow, Class A1 GDM. Anemia  Discharge Information: Date: 03/03/2013 Activity: pelvic rest Diet: routine Medications: PNV, Ibuprofen, Colace, Iron and Percocet Condition: stable Instructions: refer to practice specific booklet Discharge to: home Follow-up Information   Follow up with Kerney Hopfensperger A, MD In 6  weeks.   Specialty:  Obstetrics and Gynecology   Contact information:   [address] [place] [address] [phone]       Newborn Data:   Anjanae, Woehrle [[id number]]  Live born female  Birth Weight: 4 lb 10.4 oz (2109 g) APGAR: 8, 9   Naylani, Bradner [[id number]]  Live born female  Birth Weight: 3 lb 0.7 oz (1381 g) APGAR: 8, 9  Infants to remain in NICU.  FISHER,JULIE K 03/03/2013, 4:34 PM

## 2013-03-15 ENCOUNTER — Ambulatory Visit: Payer: Self-pay

## 2013-03-15 NOTE — Lactation Note (Signed)
This note was copied from the chart of Felicia Khaylee [name]. Lactation Consultation Note   Follow up consult with this  family of twins. Dad bottle fed baby B, while I assisted mom with breast feeding Baby A. They are now 2 weeks old, and 35 3/[redacted] weeks gestation. Felicia [name] weighs 5 - 4.3  She was initially sleepy, and would not maintain a latch. I then tried a 20 nipple shield, and with a pre and post latch, she transferred 2 mls after 15 minutes on strong, intermittent suckles. Lots of milk in nipple shield. Mom was surprised at how little she transferred. i explained how this was normal, since Felicia [name] was more nipple sucking than breast feeding, due to her size and gestation. LPT babies and breast feeding reviewed with mom. I explained that a lot of breast feeding transition happens after the babies go home, and that o/p lactation support will be available to mom at that time. Mom has been pumping only 4-5  Times  A day, expressing about 8 ounces of milk. She was going 7 hours at night without pumping. I advised her to increase to at least every 4 hours at night, and every 2-3 during the day, to pump at least 8 times a day. I explained that even though mom is making a good supply now, that she could possibly lose her supply by not pumping at night. Mom thought she should not make more than they need for now. I explained she needed now to express the amount they will need once they are corrected to term, and after. This made sense to mom. She is going to journal with pumping, so we together can see how her milk supply increased. i will follow this family in the nICU.  Patient Name: Felicia [name] Today's Date: 03/15/2013     Maternal Data    Feeding Feeding Type: Breast Milk Nipple Type: Slow - flow Length of feed: 35 min  LATCH Score/Interventions                      Lactation Tools Discussed/Used     Consult Status      Felicia [name] 03/15/2013, 7:03 PM    

## 2014-05-07 ENCOUNTER — Encounter (HOSPITAL_COMMUNITY): Payer: Self-pay | Admitting: Obstetrics and Gynecology

## 2014-10-31 IMAGING — US US FETAL BPP W/O NONSTRESS
1 series · 12 of 28 positions shown · non-contrast
Comparison: none

[Series 1: us fetal bpp w/o nonstress · 34 acquisitions, 12 frames shown]
[im 2/34]
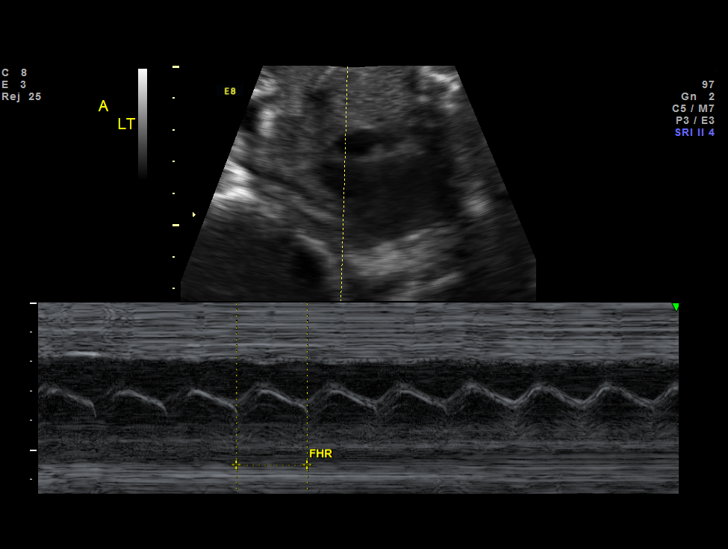
[im 4/34]
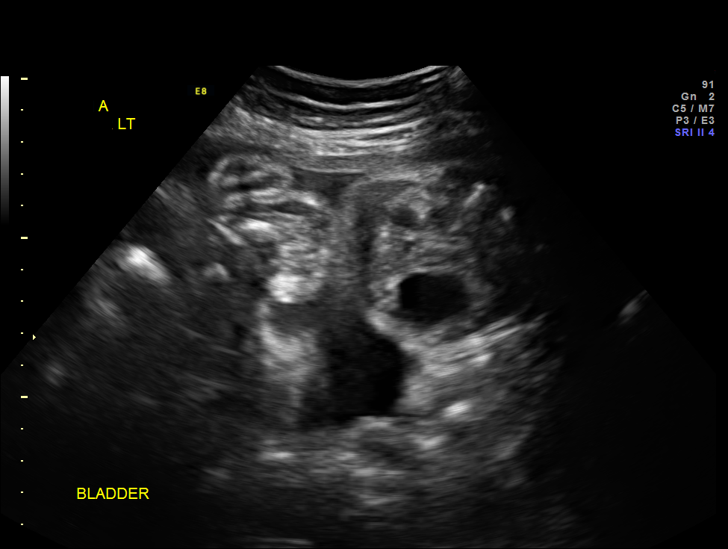
[im 7/34]
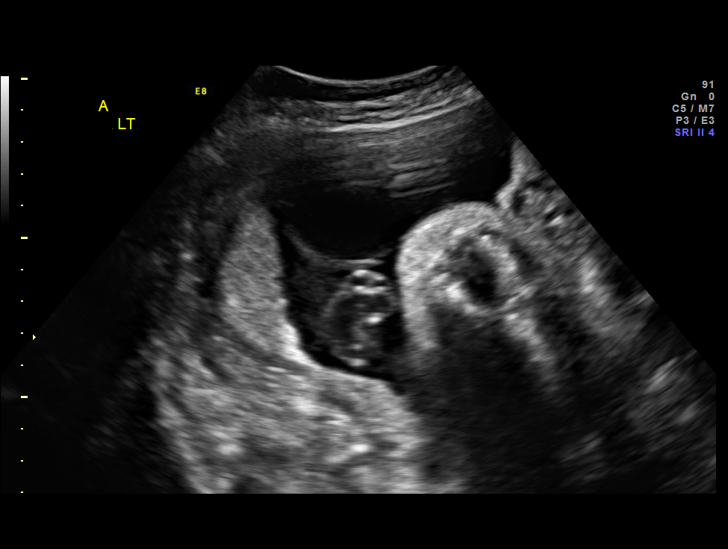
[im 10/34]
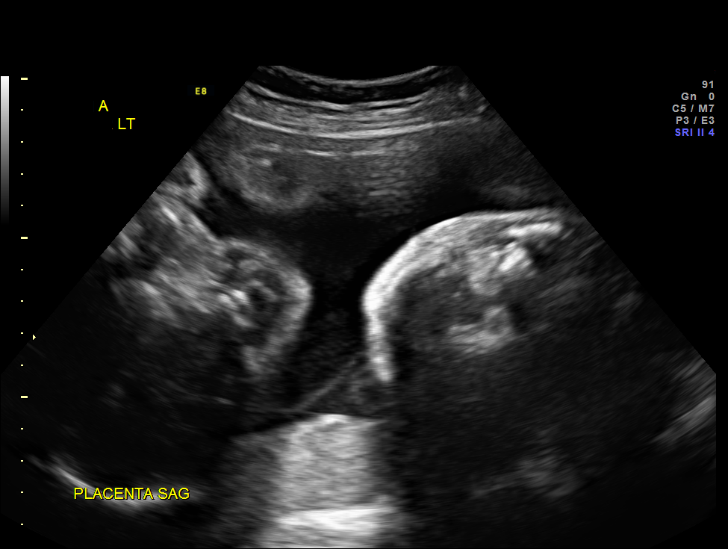
[im 13/34]
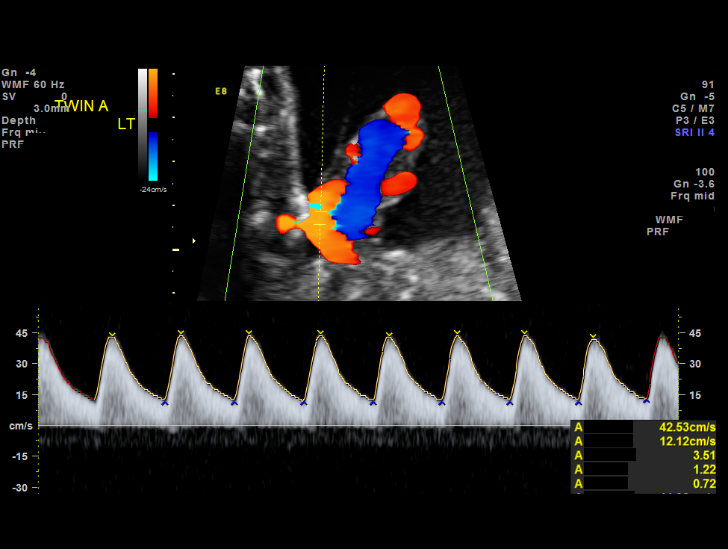
[im 15/34]
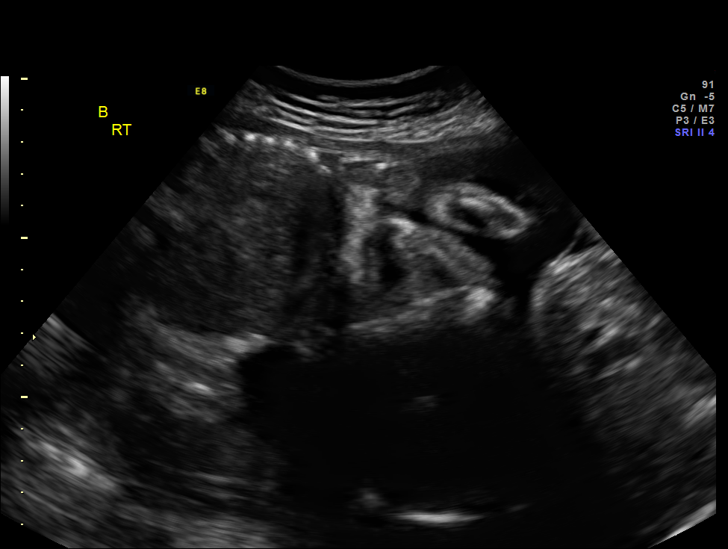
[im 19/34]
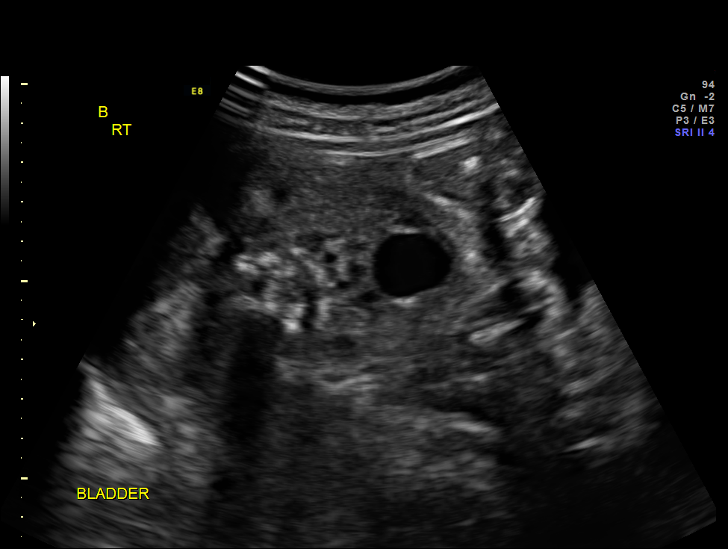
[im 21/34]
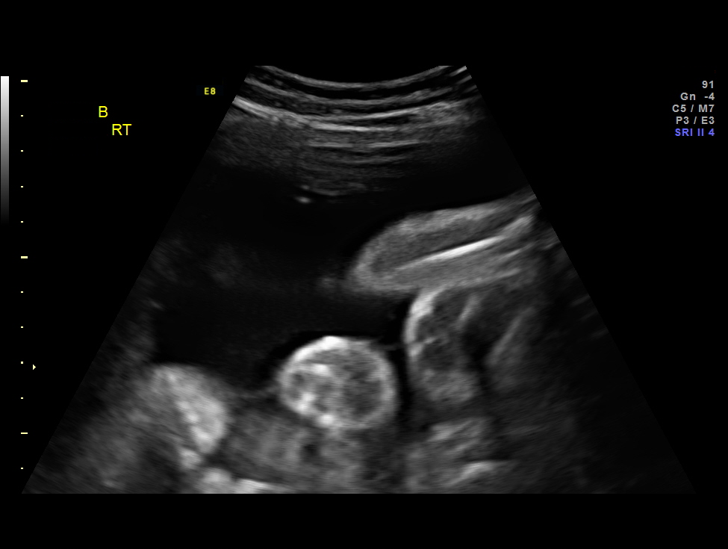
[im 24/34]
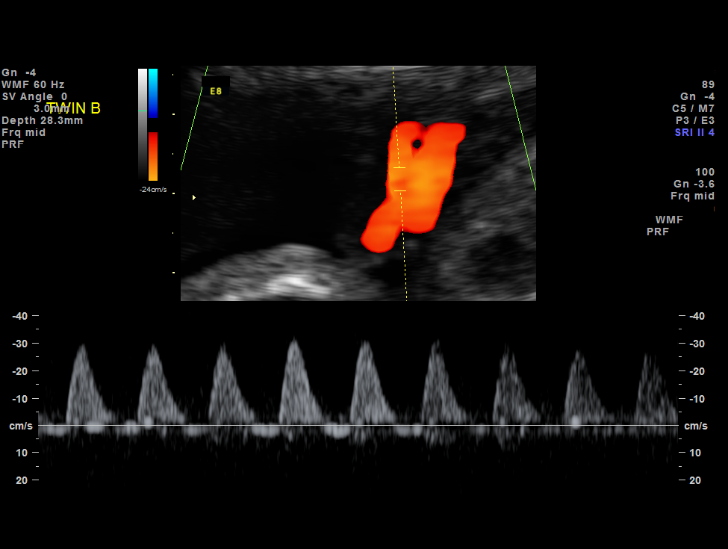
[im 27/34]
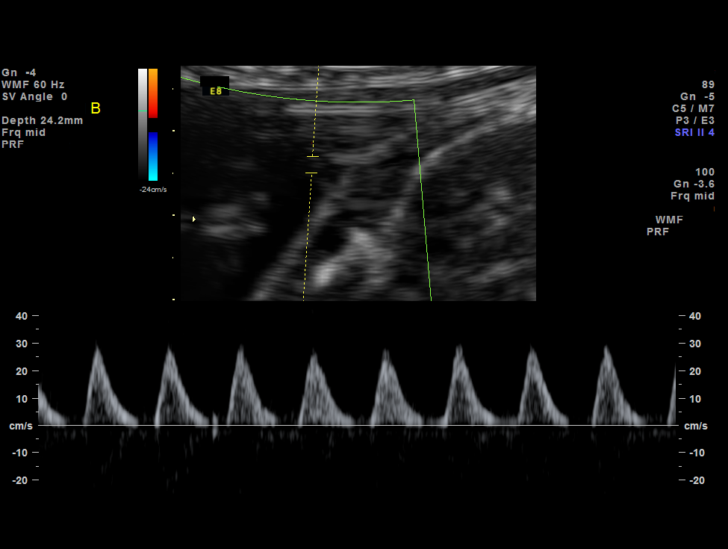
[im 30/34]
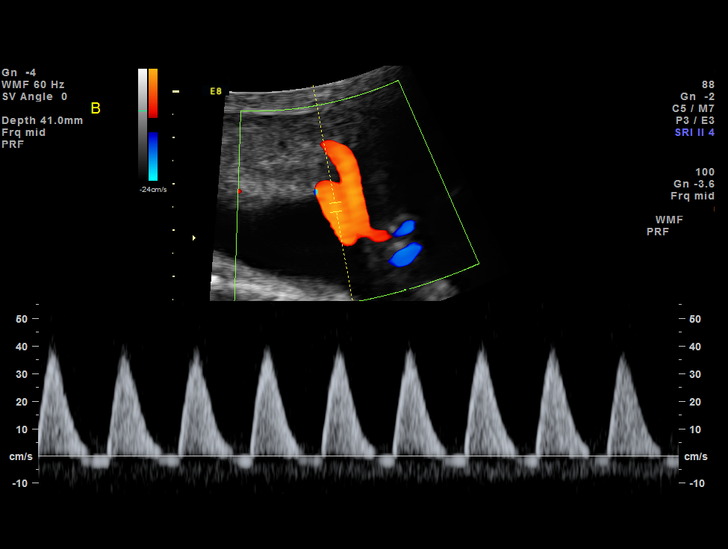
[im 32/34]
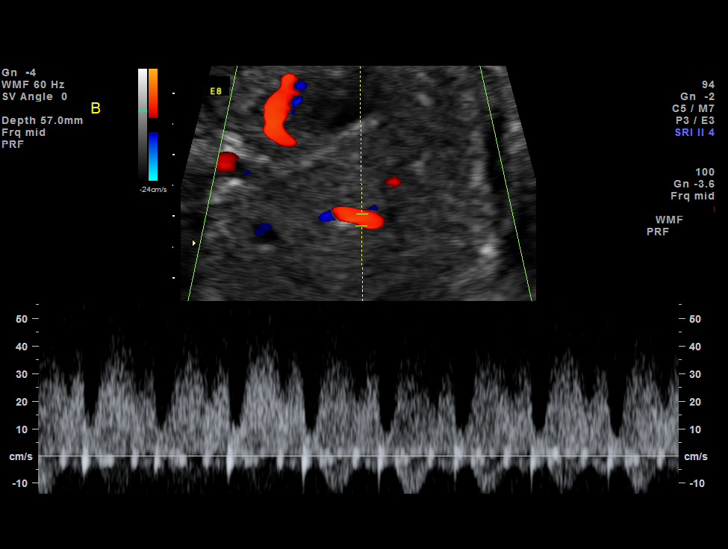

[12 of 28 positions shown; findings below may reference images not displayed]

OBSTETRICS REPORT
                      (Signed Final 02/09/2013 [DATE])

Service(s) Provided

 US UA CORD DOPPLER                                    76820.0
 US UA ADDL GEST                                       76820.1
Indications

 Di-Di Twin pregnancy, twins discordant, antepartum
 Late Prenatal Care
 Diabetes - Gestational, A1 (diet controlled)
 Size less than dates (Small for gestational [AGE]
 Twin B
 Elevated UA dopplers - Twin B
Fetal Evaluation (Fetus A)

 Num Of Fetuses:    2
 Fetal Heart Rate:  142                          bpm
 Cardiac Activity:  Observed
 Fetal Lie:         Lower left Fetus
 Presentation:      Cephalic
 Placenta:          Posterior, above cervical
                    os

 Membrane Desc:     Dividing
                    Membrane seen
                    - Dichorionic.

 Comment:    Breathing noted intermittently, but not sustained.

 Amniotic Fluid
 AFI FV:      Subjectively within normal limits
                                             Larg Pckt:     4.2  cm
Biophysical Evaluation (Fetus A)

 Amniotic F.V:   Pocket => 2 cm two         F. Tone:        Observed
                 planes
 F. Movement:    Observed                   Score:          [DATE]
 F. Breathing:   Not Observed
Gestational Age (Fetus A)
 LMP:           35w 4d        Date:  06/05/12                 EDD:   03/12/13
 Best:          30w 4d     Det. By:  Early Ultrasound         EDD:   04/16/13
Doppler - Fetal Vessels (Fetus A)

 Umbilical Artery
 S/D:   3.3            72  %tile       RI:
 PI:    1.17                           PSV:       42.53   cm/s
 Umbilical Artery
 Absent DFV:    No     Reverse DFV:    No

Fetal Evaluation (Fetus B)

 Num Of Fetuses:    2
 Fetal Heart Rate:  144                          bpm
 Cardiac Activity:  Observed
 Fetal Lie:         Upper right Fetus
 Presentation:      Cephalic
 Placenta:          Posterior, above cervical
                    os

 Membrane Desc:     Dividing
                    Membrane seen
                    - Dichorionic.

 Comment:    Breathing noted intermittently, but not sustained.

 Amniotic Fluid
 AFI FV:      Subjectively within normal limits
                                             Larg Pckt:     7.2  cm
Biophysical Evaluation (Fetus B)

 Amniotic F.V:   Pocket => 2 cm two         F. Tone:        Observed
                 planes
 F. Movement:    Observed                   Score:          [DATE]
 F. Breathing:   Not Observed
Gestational Age (Fetus B)

 LMP:           35w 4d        Date:  06/05/12                 EDD:   03/12/13
 Best:          30w 4d     Det. By:  Early Ultrasound         EDD:   04/16/13
Doppler - Fetal Vessels (Fetus B)

 Umbilical Artery
 Absent DFV:    Yes    Reverse DFV:    Yes

Impression

 DC/DA twin gestation with best dates of 30 [DATE] weeks
 Follow up due to discordant growth, Twin B with lagging AC
 and elevated UA Dopplers
 BPP [DATE] x 2 (absent breathing movement in both twins)
 Normal amniotic fluid volume x 2
 UA Dopplers:
 A- normal for gestational age
 B- 2 tracings with reversed diastolic flow; remainder with
 absent end diastolic flow
 Normal ductus venosus waverform
Recommendations

 Recommend admission for fetal monitoring - discussed with
 Dr. Atiku
 Klever
 Justo UA Dopplers and BPPs

 Would move toward delivery for non reassuring fetal tracing,
 persistent reversed diastolic flow on UA Dopplers or
 abnormal Ductus venosus waveforms on Twin B.

 questions or concerns.

## 2015-03-13 ENCOUNTER — Encounter: Payer: No Typology Code available for payment source | Attending: Obstetrics and Gynecology

## 2015-03-13 VITALS — Ht 65.0 in | Wt 152.4 lb

## 2015-03-13 DIAGNOSIS — O24419 Gestational diabetes mellitus in pregnancy, unspecified control: Secondary | ICD-10-CM | POA: Diagnosis not present

## 2015-03-13 DIAGNOSIS — Z713 Dietary counseling and surveillance: Secondary | ICD-10-CM | POA: Diagnosis not present

## 2015-03-19 NOTE — Progress Notes (Signed)
  Patient was seen on 03/13/15 for Gestational Diabetes self-management . The following learning objectives were met by the patient :   States the definition of Gestational Diabetes  States why dietary management is important in controlling blood glucose  Describes the effects of carbohydrates on blood glucose levels  Demonstrates ability to create a balanced meal plan  Demonstrates carbohydrate counting   States when to check blood glucose levels  Demonstrates proper blood glucose monitoring techniques  States the effect of stress and exercise on blood glucose levels  States the importance of limiting caffeine and abstaining from alcohol and smoking  Plan:  Aim for 2 Carb Choices per meal (30 grams) +/- 1 either way for breakfast Aim for 3 Carb Choices per meal (45 grams) +/- 1 either way from lunch and dinner Aim for 1-2 Carbs per snack Begin reading food labels for Total Carbohydrate and sugar grams of foods Consider  increasing your activity level by walking daily as tolerated Begin checking BG before breakfast and 2 hours after first bit of breakfast, lunch and dinner after  as directed by MD  Take medication  as directed by MD  Blood glucose monitor given: One Touch Verio Flex  Lot # [id number] X Exp: 05/2016 Blood glucose reading: 178m/dl  Patient instructed to monitor glucose levels: FBS: 60 - <90 2 hour: <120  Patient received the following handouts:  Nutrition Diabetes and Pregnancy  Carbohydrate Counting List  Meal Planning worksheet  Patient will be seen for follow-up as needed.

## 2015-05-10 ENCOUNTER — Other Ambulatory Visit: Payer: Self-pay | Admitting: Obstetrics and Gynecology

## 2015-05-16 ENCOUNTER — Encounter (HOSPITAL_COMMUNITY): Payer: Self-pay

## 2015-05-16 ENCOUNTER — Encounter (HOSPITAL_COMMUNITY)
Admission: RE | Admit: 2015-05-16 | Discharge: 2015-05-16 | Disposition: A | Discharge status: Home / Self Care | Payer: No Typology Code available for payment source | Source: Ambulatory Visit | Attending: Obstetrics and Gynecology | Admitting: Obstetrics and Gynecology

## 2015-05-16 LAB — BASIC METABOLIC PANEL
Anion gap: 8 (ref 5–15)
BUN: 9 mg/dL (ref 6–20)
CALCIUM: 8.7 mg/dL — AB (ref 8.9–10.3)
CHLORIDE: 105 mmol/L (ref 101–111)
CO2: 21 mmol/L — ABNORMAL LOW (ref 22–32)
CREATININE: 0.58 mg/dL (ref 0.44–1.00)
GFR calc non Af Amer: >60 mL/min (ref >60)
Glucose, Bld: 96 mg/dL (ref 65–99)
Potassium: 3.6 mmol/L (ref 3.5–5.1)
SODIUM: 134 mmol/L — AB (ref 135–145)

## 2015-05-16 LAB — CBC
HEMATOCRIT: 37.8 % (ref 36.0–46.0)
Hemoglobin: 13.3 g/dL (ref 12.0–15.0)
MCH: 32.7 pg (ref 26.0–34.0)
MCHC: 35.2 g/dL (ref 30.0–36.0)
MCV: 92.9 fL (ref 78.0–100.0)
Platelets: 142 10*3/uL — ABNORMAL LOW (ref 150–400)
RBC: 4.07 MIL/uL (ref 3.87–5.11)
RDW: 13.2 % (ref 11.5–15.5)
WBC: 6.4 10*3/uL (ref 4.0–10.5)

## 2015-05-16 LAB — TYPE AND SCREEN
ABO/RH(D): A POS
Antibody Screen: NEGATIVE

## 2015-05-16 NOTE — Patient Instructions (Addendum)
   Your procedure is scheduled on: NOV 12 (SATURDAY)  ENTER THROUGH MATERNITY ADMISSIONS AT 730AM  Pick up the phone at the desk and dial [phone] and inform us of your arrival.  Please call this number if you have any problems the morning of surgery: [phone]  DO NOT EAT OR DRINK AFTER MIDNIGHT (FRIDAY)   Do not wear jewelry, make-up, or FINGER nail polish No metal in your hair or on your body. Do not wear lotions, powders, perfumes.  You may wear deodorant.  Do not bring valuables to the hospital. Contacts, dentures or bridgework may not be worn into surgery.  Leave suitcase in the car. After Surgery it may be brought to your room. For patients being admitted to the hospital, checkout time is 11:00am the day of discharge.

## 2015-05-17 LAB — RPR: RPR Ser Ql: NONREACTIVE

## 2015-05-17 NOTE — Anesthesia Preprocedure Evaluation (Addendum)
Anesthesia Evaluation  Patient identified by MRN, date of birth, ID band Patient awake    Reviewed: Allergy & Precautions, H&P , NPO status , Patient's Chart, lab work & pertinent test results  History of Anesthesia Complications Negative for: history of anesthetic complications  Airway Mallampati: II  TM Distance: >3 FB Neck ROM: full    Dental no notable dental hx. (+) Dental Advisory Given   Pulmonary former smoker,    Pulmonary exam normal        Cardiovascular negative cardio ROS Normal cardiovascular exam     Neuro/Psych negative neurological ROS  negative psych ROS   GI/Hepatic negative GI ROS, Neg liver ROS,   Endo/Other  diabetes, Gestational  Renal/GU negative Renal ROS  negative genitourinary   Musculoskeletal negative musculoskeletal ROS (+)   Abdominal Normal abdominal exam  (+)   Peds negative pediatric ROS (+)  Hematology negative hematology ROS (+)   Anesthesia Other Findings   Reproductive/Obstetrics (+) Pregnancy Prior C/S x1                          Anesthesia Physical Anesthesia Plan  ASA: II  Anesthesia Plan: Spinal   Post-op Pain Management:    Induction:   Airway Management Planned:   Additional Equipment:   Intra-op Plan:   Post-operative Plan:   Informed Consent: I have reviewed the patients History and Physical, chart, labs and discussed the procedure including the risks, benefits and alternatives for the proposed anesthesia with the patient or authorized representative who has indicated his/her understanding and acceptance.   Dental advisory given  Plan Discussed with: CRNA and Surgeon  Anesthesia Plan Comments:       Anesthesia Quick Evaluation

## 2015-05-18 ENCOUNTER — Inpatient Hospital Stay (HOSPITAL_COMMUNITY): Payer: No Typology Code available for payment source | Admitting: Anesthesiology

## 2015-05-18 ENCOUNTER — Encounter (HOSPITAL_COMMUNITY)
Admission: AD | Disposition: A | Discharge status: Home / Self Care | Payer: Self-pay | Source: Ambulatory Visit | Attending: Obstetrics and Gynecology

## 2015-05-18 ENCOUNTER — Inpatient Hospital Stay (HOSPITAL_COMMUNITY)
Admission: AD | Admit: 2015-05-18 | Discharge: 2015-05-21 | DRG: 765 | Disposition: A | Discharge status: Home / Self Care | Payer: No Typology Code available for payment source | Source: Ambulatory Visit | Attending: Obstetrics and Gynecology | Admitting: Obstetrics and Gynecology

## 2015-05-18 ENCOUNTER — Encounter (HOSPITAL_COMMUNITY): Payer: Self-pay | Admitting: *Deleted

## 2015-05-18 ENCOUNTER — Inpatient Hospital Stay (HOSPITAL_COMMUNITY)
Admission: AD | Admit: 2015-05-18 | Payer: No Typology Code available for payment source | Source: Ambulatory Visit | Admitting: Obstetrics and Gynecology

## 2015-05-18 DIAGNOSIS — O34219 Maternal care for unspecified type scar from previous cesarean delivery: Secondary | ICD-10-CM | POA: Diagnosis present

## 2015-05-18 DIAGNOSIS — O9912 Other diseases of the blood and blood-forming organs and certain disorders involving the immune mechanism complicating childbirth: Secondary | ICD-10-CM | POA: Diagnosis present

## 2015-05-18 DIAGNOSIS — O24429 Gestational diabetes mellitus in childbirth, unspecified control: Secondary | ICD-10-CM | POA: Diagnosis present

## 2015-05-18 DIAGNOSIS — D6959 Other secondary thrombocytopenia: Secondary | ICD-10-CM | POA: Diagnosis present

## 2015-05-18 DIAGNOSIS — O480 Post-term pregnancy: Secondary | ICD-10-CM | POA: Diagnosis present

## 2015-05-18 DIAGNOSIS — Z3A38 38 weeks gestation of pregnancy: Secondary | ICD-10-CM | POA: Diagnosis not present

## 2015-05-18 DIAGNOSIS — O24410 Gestational diabetes mellitus in pregnancy, diet controlled: Secondary | ICD-10-CM | POA: Diagnosis present

## 2015-05-18 DIAGNOSIS — Z87891 Personal history of nicotine dependence: Secondary | ICD-10-CM

## 2015-05-18 LAB — GLUCOSE, CAPILLARY
GLUCOSE-CAPILLARY: 82 mg/dL (ref 65–99)
GLUCOSE-CAPILLARY: 80 mg/dL (ref 65–99)
Glucose-Capillary: 75 mg/dL (ref 65–99)

## 2015-05-18 SURGERY — Surgical Case
Anesthesia: Spinal

## 2015-05-18 MED ORDER — MEPERIDINE HCL 25 MG/ML IJ SOLN
INTRAMUSCULAR | Status: DC | PRN
  Administered 2015-05-18 (×2): 12.5 mg via INTRAVENOUS

## 2015-05-18 MED ORDER — IBUPROFEN 600 MG PO TABS
600.0000 mg | ORAL_TABLET | Freq: Four times a day (QID) | ORAL | Status: DC
  Administered 2015-05-18 – 2015-05-19 (×3): 600 mg via ORAL
  Filled 2015-05-18 (×3): qty 1

## 2015-05-18 MED ORDER — FERROUS SULFATE 325 (65 FE) MG PO TABS
325.0000 mg | ORAL_TABLET | Freq: Two times a day (BID) | ORAL | Status: DC
  Administered 2015-05-18 – 2015-05-21 (×4): 325 mg via ORAL
  Filled 2015-05-18 (×5): qty 1

## 2015-05-18 MED ORDER — BUPIVACAINE HCL (PF) 0.25 % IJ SOLN
INTRAMUSCULAR | Status: AC
  Filled 2015-05-18: qty 10

## 2015-05-18 MED ORDER — DIBUCAINE 1 % RE OINT: 1.0000 "application " | TOPICAL_OINTMENT | RECTAL | Status: DC | PRN

## 2015-05-18 MED ORDER — DIPHENHYDRAMINE HCL 25 MG PO CAPS: 25.0000 mg | ORAL_CAPSULE | Freq: Four times a day (QID) | ORAL | Status: DC | PRN

## 2015-05-18 MED ORDER — OXYTOCIN 10 UNIT/ML IJ SOLN
40.0000 [IU] | INTRAMUSCULAR | Status: DC | PRN
  Administered 2015-05-18: 40 [IU] via INTRAVENOUS

## 2015-05-18 MED ORDER — SIMETHICONE 80 MG PO CHEW
80.0000 mg | CHEWABLE_TABLET | ORAL | Status: DC
  Administered 2015-05-19 – 2015-05-21 (×3): 80 mg via ORAL
  Filled 2015-05-18 (×3): qty 1

## 2015-05-18 MED ORDER — LACTATED RINGERS IV SOLN
INTRAVENOUS | Status: DC | PRN
  Administered 2015-05-18 (×3): via INTRAVENOUS

## 2015-05-18 MED ORDER — OXYCODONE-ACETAMINOPHEN 5-325 MG PO TABS
1.0000 | ORAL_TABLET | ORAL | Status: DC | PRN
  Administered 2015-05-19: 1 via ORAL
  Filled 2015-05-18: qty 1

## 2015-05-18 MED ORDER — SENNOSIDES-DOCUSATE SODIUM 8.6-50 MG PO TABS
2.0000 | ORAL_TABLET | ORAL | Status: DC
  Administered 2015-05-19 – 2015-05-21 (×3): 2 via ORAL
  Filled 2015-05-18 (×3): qty 2

## 2015-05-18 MED ORDER — FLEET ENEMA 7-19 GM/118ML RE ENEM: 1.0000 | ENEMA | Freq: Every day | RECTAL | Status: DC | PRN

## 2015-05-18 MED ORDER — ONDANSETRON HCL 4 MG/2ML IJ SOLN: 4.0000 mg | Freq: Once | INTRAMUSCULAR | Status: DC | PRN

## 2015-05-18 MED ORDER — ACETAMINOPHEN 325 MG PO TABS
650.0000 mg | ORAL_TABLET | ORAL | Status: DC | PRN
Start: 2015-05-18 — End: 2015-05-21
  Administered 2015-05-18 (×2): 650 mg via ORAL
  Filled 2015-05-18 (×2): qty 2

## 2015-05-18 MED ORDER — PRENATAL MULTIVITAMIN CH
1.0000 | ORAL_TABLET | Freq: Every day | ORAL | Status: DC
  Administered 2015-05-18 – 2015-05-21 (×4): 1 via ORAL
  Filled 2015-05-18 (×4): qty 1

## 2015-05-18 MED ORDER — SODIUM CHLORIDE 0.9 % IR SOLN
Status: DC | PRN
  Administered 2015-05-18: 1

## 2015-05-18 MED ORDER — METHYLERGONOVINE MALEATE 0.2 MG/ML IJ SOLN: 0.2000 mg | INTRAMUSCULAR | Status: DC | PRN

## 2015-05-18 MED ORDER — MENTHOL 3 MG MT LOZG: 1.0000 | LOZENGE | OROMUCOSAL | Status: DC | PRN

## 2015-05-18 MED ORDER — ONDANSETRON HCL 4 MG/2ML IJ SOLN
INTRAMUSCULAR | Status: AC
  Filled 2015-05-18: qty 2

## 2015-05-18 MED ORDER — CEFAZOLIN SODIUM-DEXTROSE 2-3 GM-% IV SOLR
2.0000 g | INTRAVENOUS | Status: AC
  Administered 2015-05-18: 2 g via INTRAVENOUS
  Filled 2015-05-18: qty 50

## 2015-05-18 MED ORDER — OXYTOCIN 10 UNIT/ML IJ SOLN
INTRAMUSCULAR | Status: AC
  Filled 2015-05-18: qty 4

## 2015-05-18 MED ORDER — WITCH HAZEL-GLYCERIN EX PADS: 1.0000 "application " | MEDICATED_PAD | CUTANEOUS | Status: DC | PRN

## 2015-05-18 MED ORDER — SIMETHICONE 80 MG PO CHEW
80.0000 mg | CHEWABLE_TABLET | ORAL | Status: DC | PRN
  Administered 2015-05-20: 80 mg via ORAL
  Filled 2015-05-18: qty 1

## 2015-05-18 MED ORDER — SODIUM CHLORIDE 0.9 % IJ SOLN: 3.0000 mL | INTRAMUSCULAR | Status: DC | PRN

## 2015-05-18 MED ORDER — FENTANYL CITRATE (PF) 100 MCG/2ML IJ SOLN
INTRAMUSCULAR | Status: AC
Start: 2015-05-18 — End: 2015-05-18
  Filled 2015-05-18: qty 2

## 2015-05-18 MED ORDER — KETOROLAC TROMETHAMINE 30 MG/ML IJ SOLN
30.0000 mg | Freq: Four times a day (QID) | INTRAMUSCULAR | Status: AC | PRN
  Administered 2015-05-18: 30 mg via INTRAMUSCULAR

## 2015-05-18 MED ORDER — BISACODYL 10 MG RE SUPP: 10.0000 mg | Freq: Every day | RECTAL | Status: DC | PRN

## 2015-05-18 MED ORDER — PHENYLEPHRINE 8 MG IN D5W 100 ML (0.08MG/ML) PREMIX OPTIME
INJECTION | INTRAVENOUS | Status: AC
  Filled 2015-05-18: qty 100

## 2015-05-18 MED ORDER — KETOROLAC TROMETHAMINE 30 MG/ML IJ SOLN: 30.0000 mg | Freq: Four times a day (QID) | INTRAMUSCULAR | Status: AC | PRN

## 2015-05-18 MED ORDER — OXYCODONE-ACETAMINOPHEN 5-325 MG PO TABS
2.0000 | ORAL_TABLET | ORAL | Status: DC | PRN
  Administered 2015-05-18 – 2015-05-21 (×14): 2 via ORAL
  Filled 2015-05-18 (×14): qty 2

## 2015-05-18 MED ORDER — SCOPOLAMINE 1 MG/3DAYS TD PT72
1.0000 | MEDICATED_PATCH | Freq: Once | TRANSDERMAL | Status: DC
  Administered 2015-05-18: 1.5 mg via TRANSDERMAL
  Filled 2015-05-18: qty 1

## 2015-05-18 MED ORDER — FENTANYL CITRATE (PF) 100 MCG/2ML IJ SOLN
25.0000 ug | INTRAMUSCULAR | Status: DC | PRN
  Administered 2015-05-18: 50 ug via INTRAVENOUS

## 2015-05-18 MED ORDER — LACTATED RINGERS IV SOLN
INTRAVENOUS | Status: DC
  Administered 2015-05-18: 09:00:00 via INTRAVENOUS

## 2015-05-18 MED ORDER — DEXAMETHASONE SODIUM PHOSPHATE 4 MG/ML IJ SOLN
INTRAMUSCULAR | Status: AC
  Filled 2015-05-18: qty 1

## 2015-05-18 MED ORDER — FENTANYL CITRATE (PF) 100 MCG/2ML IJ SOLN
INTRAMUSCULAR | Status: AC
  Filled 2015-05-18: qty 4

## 2015-05-18 MED ORDER — FENTANYL CITRATE (PF) 100 MCG/2ML IJ SOLN
INTRAMUSCULAR | Status: DC | PRN
  Administered 2015-05-18: 90 ug via INTRAVENOUS
  Administered 2015-05-18: 10 ug via INTRAVENOUS

## 2015-05-18 MED ORDER — PHENYLEPHRINE 8 MG IN D5W 100 ML (0.08MG/ML) PREMIX OPTIME
INJECTION | INTRAVENOUS | Status: DC | PRN
  Administered 2015-05-18: 60 ug/min via INTRAVENOUS

## 2015-05-18 MED ORDER — BUPIVACAINE HCL (PF) 0.25 % IJ SOLN
INTRAMUSCULAR | Status: DC | PRN
  Administered 2015-05-18: 10 mL

## 2015-05-18 MED ORDER — MEPERIDINE HCL 25 MG/ML IJ SOLN: 6.2500 mg | INTRAMUSCULAR | Status: DC | PRN

## 2015-05-18 MED ORDER — SIMETHICONE 80 MG PO CHEW
80.0000 mg | CHEWABLE_TABLET | Freq: Three times a day (TID) | ORAL | Status: DC
  Administered 2015-05-18 – 2015-05-21 (×8): 80 mg via ORAL
  Filled 2015-05-18 (×8): qty 1

## 2015-05-18 MED ORDER — SODIUM CHLORIDE 0.9 % IJ SOLN
3.0000 mL | Freq: Two times a day (BID) | INTRAMUSCULAR | Status: DC
  Administered 2015-05-19: 3 mL via INTRAVENOUS

## 2015-05-18 MED ORDER — SODIUM CHLORIDE 0.9 % IV SOLN: 250.0000 mL | INTRAVENOUS | Status: DC

## 2015-05-18 MED ORDER — KETOROLAC TROMETHAMINE 30 MG/ML IJ SOLN
INTRAMUSCULAR | Status: AC
  Administered 2015-05-18: 30 mg via INTRAMUSCULAR
  Filled 2015-05-18: qty 1

## 2015-05-18 MED ORDER — ZOLPIDEM TARTRATE 5 MG PO TABS: 5.0000 mg | ORAL_TABLET | Freq: Every evening | ORAL | Status: DC | PRN

## 2015-05-18 MED ORDER — OXYTOCIN 40 UNITS IN LACTATED RINGERS INFUSION - SIMPLE MED: 62.5000 mL/h | INTRAVENOUS | Status: AC

## 2015-05-18 MED ORDER — LANOLIN HYDROUS EX OINT: 1.0000 "application " | TOPICAL_OINTMENT | CUTANEOUS | Status: DC | PRN

## 2015-05-18 MED ORDER — MORPHINE SULFATE (PF) 0.5 MG/ML IJ SOLN
INTRAMUSCULAR | Status: AC
  Filled 2015-05-18: qty 100

## 2015-05-18 MED ORDER — KETOROLAC TROMETHAMINE 30 MG/ML IJ SOLN
INTRAMUSCULAR | Status: AC
  Filled 2015-05-18: qty 1

## 2015-05-18 MED ORDER — LACTATED RINGERS IV SOLN
INTRAVENOUS | Status: DC
  Administered 2015-05-18: 20:00:00 via INTRAVENOUS

## 2015-05-18 MED ORDER — MORPHINE SULFATE (PF) 0.5 MG/ML IJ SOLN
INTRAMUSCULAR | Status: DC | PRN
  Administered 2015-05-18: .5 mg via INTRAVENOUS
  Administered 2015-05-18: 1.3 mg via INTRAVENOUS
  Administered 2015-05-18: .2 mg via EPIDURAL
  Administered 2015-05-18: .5 mg via INTRAVENOUS

## 2015-05-18 MED ORDER — METHYLERGONOVINE MALEATE 0.2 MG PO TABS
0.2000 mg | ORAL_TABLET | ORAL | Status: DC | PRN
Start: 2015-05-18 — End: 2015-05-21

## 2015-05-18 MED ORDER — BUPIVACAINE IN DEXTROSE 0.75-8.25 % IT SOLN
INTRATHECAL | Status: DC | PRN
  Administered 2015-05-18: 1.6 mL via INTRATHECAL

## 2015-05-18 MED ORDER — ONDANSETRON HCL 4 MG/2ML IJ SOLN
INTRAMUSCULAR | Status: DC | PRN
  Administered 2015-05-18: 4 mg via INTRAVENOUS

## 2015-05-18 SURGICAL SUPPLY — 45 items
APL SKNCLS STERI-STRIP NONHPOA (GAUZE/BANDAGES/DRESSINGS) ×1
BARRIER ADHS 3X4 INTERCEED (GAUZE/BANDAGES/DRESSINGS) ×5 IMPLANT
BENZOIN TINCTURE PRP APPL 2/3 (GAUZE/BANDAGES/DRESSINGS) ×2 IMPLANT
BRR ADH 4X3 ABS CNTRL BYND (GAUZE/BANDAGES/DRESSINGS) ×2
CLAMP CORD UMBIL (MISCELLANEOUS) IMPLANT
CLOSURE STERI STRIP 1/2 X4 (GAUZE/BANDAGES/DRESSINGS) ×2 IMPLANT
CLOSURE WOUND 1/2 X4 (GAUZE/BANDAGES/DRESSINGS)
CLOTH BEACON ORANGE TIMEOUT ST (SAFETY) ×3 IMPLANT
CONTAINER PREFILL 10% NBF 15ML (MISCELLANEOUS) IMPLANT
DECANTER SPIKE VIAL GLASS SM (MISCELLANEOUS) ×2 IMPLANT
DRAPE C SECTION CLR SCREEN (DRAPES) ×3 IMPLANT
DRAPE SHEET LG 3/4 BI-LAMINATE (DRAPES) IMPLANT
DRSG OPSITE POSTOP 4X10 (GAUZE/BANDAGES/DRESSINGS) ×3 IMPLANT
DURAPREP 26ML APPLICATOR (WOUND CARE) ×3 IMPLANT
ELECT REM PT RETURN 9FT ADLT (ELECTROSURGICAL) ×3
ELECTRODE REM PT RTRN 9FT ADLT (ELECTROSURGICAL) ×1 IMPLANT
EXTRACTOR VACUUM M CUP 4 TUBE (SUCTIONS) IMPLANT
EXTRACTOR VACUUM M CUP 4' TUBE (SUCTIONS)
GLOVE BIOGEL PI IND STRL 7.0 (GLOVE) ×2 IMPLANT
GLOVE BIOGEL PI INDICATOR 7.0 (GLOVE) ×4
GLOVE ECLIPSE 6.5 STRL STRAW (GLOVE) ×3 IMPLANT
GOWN STRL REUS W/TWL LRG LVL3 (GOWN DISPOSABLE) ×6 IMPLANT
KIT ABG SYR 3ML LUER SLIP (SYRINGE) IMPLANT
NDL HYPO 25X5/8 SAFETYGLIDE (NEEDLE) IMPLANT
NEEDLE HYPO 22GX1.5 SAFETY (NEEDLE) ×3 IMPLANT
NEEDLE HYPO 25X5/8 SAFETYGLIDE (NEEDLE) IMPLANT
NS IRRIG 1000ML POUR BTL (IV SOLUTION) ×3 IMPLANT
PACK C SECTION WH (CUSTOM PROCEDURE TRAY) ×3 IMPLANT
PAD OB MATERNITY 4.3X12.25 (PERSONAL CARE ITEMS) ×3 IMPLANT
RTRCTR C-SECT PINK 25CM LRG (MISCELLANEOUS) ×2 IMPLANT
STRIP CLOSURE SKIN 1/2X4 (GAUZE/BANDAGES/DRESSINGS) IMPLANT
SUT CHROMIC GUT AB #0 18 (SUTURE) IMPLANT
SUT MNCRL 0 VIOLET CTX 36 (SUTURE) ×3 IMPLANT
SUT MON AB 4-0 PS1 27 (SUTURE) IMPLANT
SUT MONOCRYL 0 CTX 36 (SUTURE) ×6
SUT PLAIN 2 0 (SUTURE) ×3
SUT PLAIN 2 0 XLH (SUTURE) IMPLANT
SUT PLAIN ABS 2-0 CT1 27XMFL (SUTURE) IMPLANT
SUT VIC AB 0 CT1 36 (SUTURE) ×6 IMPLANT
SUT VIC AB 2-0 CT1 27 (SUTURE) ×3
SUT VIC AB 2-0 CT1 TAPERPNT 27 (SUTURE) ×1 IMPLANT
SUT VIC AB 4-0 PS2 27 (SUTURE) ×2 IMPLANT
SYR CONTROL 10ML LL (SYRINGE) ×3 IMPLANT
TOWEL OR 17X24 6PK STRL BLUE (TOWEL DISPOSABLE) ×3 IMPLANT
TRAY FOLEY CATH SILVER 14FR (SET/KITS/TRAYS/PACK) ×2 IMPLANT

## 2015-05-18 NOTE — Brief Op Note (Signed)
05/18/2015  10:05 AM  PATIENT:  Felicia [name]  34 y.o. female  PRE-OPERATIVE DIAGNOSIS:  Previous Cesarean Section, Class A1  Gestational Diabetes, term gestation  POST-OPERATIVE DIAGNOSIS:  Previous Cesarean Section, Class A1  Gestational Diabetes, term gestation  PROCEDURE:  Repeat cesarean section, kerr hysterotomy  SURGEON:  Surgeon(s) and Role:    * Felicia BetterSheronette Dessie Tatem, MD - Primary  PHYSICIAN ASSISTANT:   ASSISTANTS: Felicia [name], CNM   ANESTHESIA:   spinal FINDINGS: live female ROT, CAN x 2, nl tubes and ovaries Apgar 9/9, 8lb 4 oz EBL:  Total I/O In: 2700 [I.V.:2700] Out: 750 [Urine:150; Blood:600]  BLOOD ADMINISTERED:none  DRAINS: none   LOCAL MEDICATIONS USED:  MARCAINE     SPECIMEN:  No Specimen  DISPOSITION OF SPECIMEN:  N/A  COUNTS:  YES  TOURNIQUET:  * No tourniquets in log *  DICTATION: .Other Dictation: Dictation Number [id number]  PLAN OF CARE: Admit to inpatient   PATIENT DISPOSITION:  PACU - hemodynamically stable.   Delay start of Pharmacological VTE agent (>24hrs) due to surgical blood loss or risk of bleeding: no

## 2015-05-18 NOTE — Lactation Note (Signed)
This note was copied from the chart of Felicia Hamilton CapriAmber Laursen. Lactation Consultation Note  Patient Name: Felicia [name] Today's Date: 05/18/2015 Reason for consult: Initial assessment [facility] Baby has had 2 low blood sugars = 38. Lab to be in soon to re-check. Mom GDM - Diet controlled. Mom history of twins. Did not latch, mostly pump/bottle. Demonstrated to Mom how to hand express, received approx 3-4 ml of colostrum. Plan to spoon feed this back to baby after lab. Advised Mom to BF with feeding ques, but if she does not observe feeding ques by 3 hours from last feeding, place baby STS and see if she will BF. Advised baby should be at the breast 8-12 times in 24 hours and with feeding ques. Lactation brochure left for review, advised of OP services and support group.   Maternal Data Has patient been taught Hand Expression?: Yes Does the patient have breastfeeding experience prior to this delivery?: Yes  Feeding Feeding Type: Breast Fed Length of feed: 2 min  LATCH Score/Interventions Latch: Too sleepy or reluctant, no latch achieved, no sucking elicited. Intervention(s): Adjust position;Assist with latch;Breast massage;Breast compression  Audible Swallowing: None  Type of Nipple: Everted at rest and after stimulation  Comfort (Breast/Nipple): Soft / non-tender     Hold (Positioning): Assistance needed to correctly position infant at breast and maintain latch. Intervention(s): Breastfeeding basics reviewed;Support Pillows;Position options;Skin to skin  LATCH Score: 5  Lactation Tools Discussed/Used Tools: Pump Breast pump type: Double-Electric Breast Pump WIC Program: No   Consult Status Consult Status: Follow-up Date: 05/18/15 Follow-up type: In-patient    Felicia [name], Felicia [name] 05/18/2015, 4:59 PM

## 2015-05-18 NOTE — H&P (Signed)
Addendum to H&P: pt has opted not to do bilateral tubal ligation

## 2015-05-18 NOTE — Transfer of Care (Signed)
Immediate Anesthesia Transfer of Care Note  Patient: Felicia [name]  Procedure(s) Performed: Procedure(s) with comments: Repeat CESAREAN SECTION (N/A) - EDD: 05/20/15  Patient Location: PACU  Anesthesia Type:Spinal  Level of Consciousness: awake, alert  and oriented  Airway & Oxygen Therapy: Patient Spontanous Breathing  Post-op Assessment: Report given to RN and Post -op Vital signs reviewed and stable  Post vital signs: Reviewed and stable  Last Vitals:  Filed Vitals:   05/18/15 0805  BP: 145/88  Pulse: 84  Temp: 36.7 C  Resp: 18    Complications: No apparent anesthesia complications

## 2015-05-18 NOTE — Anesthesia Postprocedure Evaluation (Signed)
  Anesthesia Post-op Note  Patient: Felicia [name]  Procedure(s) Performed: Procedure(s) (LRB): Repeat CESAREAN SECTION (N/A)  Patient Location: PACU  Anesthesia Type: Spinal  Level of Consciousness: awake and alert   Airway and Oxygen Therapy: Patient Spontanous Breathing  Post-op Pain: mild  Post-op Assessment: Post-op Vital signs reviewed, Patient's Cardiovascular Status Stable, Respiratory Function Stable, Patent Airway and No signs of Nausea or vomiting  Last Vitals:  Filed Vitals:   05/18/15 1045  BP: 106/89  Pulse: 64  Temp:   Resp: 13    Post-op Vital Signs: stable   Complications: No apparent anesthesia complications

## 2015-05-18 NOTE — Lactation Note (Signed)
This note was copied from the chart of Girl Hamilton CapriAmber Critz. Lactation Consultation Note  Patient Name: Girl Hamilton Caprimber Mccreadie ONGEX'BToday's Date: 05/18/2015 Reason for consult: Follow-up assessment Mom had baby latched when LC arrived, baby demonstrating a good rhythmic suck with some swallows noted. Mom reports she finger fed approx 2 ml of colostrum obtained with hand expression last visit. She has since pumped and received approx 7 ml of colostrum. Blood sugar increased to 55. Mom will finger feed colostrum back to baby after this feeding if baby will take it. Encouraged to continue to BF each feeding. If baby nursing frequently and blood sugars stabilize then Mom does not need to pump every 3 hours. If baby does not begin waking to BF or blood sugar drops again, then encouraged to pump to have colostrum to supplement. Mom to call for assist as needed/questions or concerns.   Maternal Data Has patient been taught Hand Expression?: Yes Does the patient have breastfeeding experience prior to this delivery?: Yes  Feeding Feeding Type: Breast Fed Length of feed: 2 min  LATCH Score/Interventions Latch: Too sleepy or reluctant, no latch achieved, no sucking elicited. Intervention(s): Adjust position;Assist with latch;Breast massage;Breast compression  Audible Swallowing: None  Type of Nipple: Everted at rest and after stimulation  Comfort (Breast/Nipple): Soft / non-tender     Hold (Positioning): Assistance needed to correctly position infant at breast and maintain latch. Intervention(s): Breastfeeding basics reviewed;Support Pillows;Position options;Skin to skin  LATCH Score: 5  Lactation Tools Discussed/Used Tools: Pump Breast pump type: Double-Electric Breast Pump WIC Program: No   Consult Status Consult Status: Follow-up Date: 05/19/15 Follow-up type: In-patient    Alfred LevinsGranger, Sharnetta Gielow Ann 05/18/2015, 5:49 PM

## 2015-05-18 NOTE — H&P (Signed)
Felicia [name] is a 34 y.o. female  G2P0102 MWF @ 38 5/7 weeks presenting for repeat C/S, TL due to prior LTCS and desires permanent sterilization. PNC complicated by Class A1 GDM. GBS cx negative  Maternal Medical History:  Fetal activity: Perceived fetal activity is normal.    Prenatal Complications - Diabetes: gestational. Diabetes is managed by diet.      OB History    Gravida Para Term Preterm AB TAB SAB Ectopic Multiple Living   4 1 0 1 2 1 1 0 1 2      Past Medical History  Diagnosis Date  . Medical history non-contributory   . Gestational diabetes mellitus, antepartum    Past Surgical History  Procedure Laterality Date  . No past surgeries    . Compound fracture of right elbow  Right compound fracture of right elbow  . Cesarean section N/A 02/27/2013    Procedure: CESAREAN SECTION;  Surgeon: Serita KyleSheronette A Jaremy Nosal, MD;  Location: [facility] ORS;  Service: Obstetrics;  Laterality: N/A;   Family History: family history is not on file. Social History:  reports that she quit smoking about 2 years ago. Her smoking use included Cigarettes. She has never used smokeless tobacco. She reports that she drinks alcohol. She reports that she does not use illicit drugs.   Prenatal Transfer Tool  Maternal Diabetes: Yes:  Diabetes Type:  Diet controlled Genetic Screening: Normal Maternal Ultrasounds/Referrals: Normal Fetal Ultrasounds or other Referrals:  None Maternal Substance Abuse:  No Significant Maternal Medications:  None Significant Maternal Lab Results:  Lab values include: Group B Strep negative Other Comments:  previous c/s  ROS    Last menstrual period 08/10/2014, unknown if currently breastfeeding. Maternal Exam:  Uterine Assessment: Contraction strength is mild.  Contraction frequency is irregular.   Abdomen: Patient reports no abdominal tenderness. Surgical scars: low transverse.   Estimated fetal weight is 7lb 12 oz.   Fetal presentation: vertex  Introitus: Normal  vulva.   Physical Exam  Constitutional: She is oriented to person, place, and time. She appears well-developed and well-nourished.  HENT:  Head: Atraumatic.  Eyes: EOM are normal.  Neck: Neck supple.  Cardiovascular: Regular rhythm.   Respiratory: Breath sounds normal.  GI: Soft.  Musculoskeletal: She exhibits no edema.  Neurological: She is alert and oriented to person, place, and time.  Skin: Skin is warm and dry.  Psychiatric: She has a normal mood and affect.    Prenatal labs: ABO, Rh: --/--/A POS (11/10 0940) Antibody: NEG (11/10 0940) Rubella:  Immune RPR: Non Reactive (11/10 0940)  HBsAg:   neg HIV:   NR GBS:   negative  CBC    Component Value Date/Time   WBC 6.4 05/16/2015 0940   RBC 4.07 05/16/2015 0940   HGB 13.3 05/16/2015 0940   HCT 37.8 05/16/2015 0940   PLT 142* 05/16/2015 0940   MCV 92.9 05/16/2015 0940   MCH 32.7 05/16/2015 0940   MCHC 35.2 05/16/2015 0940   RDW 13.2 05/16/2015 0940     Assessment/Plan: Previous C/S Class a1 GDM  desires sterilization Term P)R C/S, BTL. Risk of surgery reviewed including infection, bleeding, injury to surrounding organ structure, permanent, nonreversible, failure rate 1/300, pos blood transfusion and its risk. All ? Answered   Felicia [name] A 05/18/2015, 4:55 AM

## 2015-05-18 NOTE — Anesthesia Procedure Notes (Signed)
Spinal Patient location during procedure: OR Staffing Anesthesiologist: Patrich Heinze Performed by: anesthesiologist  Preanesthetic Checklist Completed: patient identified, site marked, surgical consent, pre-op evaluation, timeout performed, IV checked, risks and benefits discussed and monitors and equipment checked Spinal Block Patient position: sitting Prep: ChloraPrep Patient monitoring: continuous pulse ox, blood pressure and heart rate Approach: midline Location: L3-4 Injection technique: single-shot Needle Needle type: Sprotte  Needle gauge: 24 G Needle length: 9 cm Additional Notes Functioning IV was confirmed and monitors were applied. Sterile prep and drape, including hand hygiene, mask and sterile gloves were used. The patient was positioned and the spine was prepped. The skin was anesthetized with lidocaine.  Free flow of clear CSF was obtained prior to injecting local anesthetic into the CSF.  The spinal needle aspirated freely following injection.  The needle was carefully withdrawn.  The patient tolerated the procedure well. Consent was obtained prior to procedure with all questions answered and concerns addressed. Risks including but not limited to bleeding, infection, nerve damage, paralysis, failed block, inadequate analgesia, allergic reaction, high spinal, itching and headache were discussed and the patient wished to proceed.   Calle Schader, MD     

## 2015-05-19 LAB — CBC
HCT: 32 % — ABNORMAL LOW (ref 36.0–46.0)
Hemoglobin: 11 g/dL — ABNORMAL LOW (ref 12.0–15.0)
MCH: 31.9 pg (ref 26.0–34.0)
MCHC: 34.4 g/dL (ref 30.0–36.0)
MCV: 92.8 fL (ref 78.0–100.0)
PLATELETS: 115 10*3/uL — AB (ref 150–400)
RBC: 3.45 MIL/uL — ABNORMAL LOW (ref 3.87–5.11)
RDW: 13.3 % (ref 11.5–15.5)
WBC: 6.9 10*3/uL (ref 4.0–10.5)

## 2015-05-19 MED ORDER — NALOXONE HCL 0.4 MG/ML IJ SOLN: 0.4000 mg | INTRAMUSCULAR | Status: DC | PRN

## 2015-05-19 MED ORDER — DIPHENHYDRAMINE HCL 50 MG/ML IJ SOLN: 12.5000 mg | INTRAMUSCULAR | Status: DC | PRN

## 2015-05-19 MED ORDER — DIPHENHYDRAMINE HCL 25 MG PO CAPS: 25.0000 mg | ORAL_CAPSULE | ORAL | Status: DC | PRN

## 2015-05-19 MED ORDER — NALBUPHINE HCL 10 MG/ML IJ SOLN: 5.0000 mg | Freq: Once | INTRAMUSCULAR | Status: DC | PRN

## 2015-05-19 MED ORDER — SODIUM CHLORIDE 0.9 % IJ SOLN: 3.0000 mL | INTRAMUSCULAR | Status: DC | PRN

## 2015-05-19 MED ORDER — IBUPROFEN 800 MG PO TABS
800.0000 mg | ORAL_TABLET | Freq: Three times a day (TID) | ORAL | Status: DC
Start: 2015-05-19 — End: 2015-05-21
  Administered 2015-05-19 – 2015-05-21 (×6): 800 mg via ORAL
  Filled 2015-05-19 (×6): qty 1

## 2015-05-19 MED ORDER — NALBUPHINE HCL 10 MG/ML IJ SOLN: 5.0000 mg | INTRAMUSCULAR | Status: DC | PRN

## 2015-05-19 MED ORDER — ONDANSETRON HCL 4 MG/2ML IJ SOLN: 4.0000 mg | Freq: Three times a day (TID) | INTRAMUSCULAR | Status: DC | PRN

## 2015-05-19 MED ORDER — SCOPOLAMINE 1 MG/3DAYS TD PT72
1.0000 | MEDICATED_PATCH | Freq: Once | TRANSDERMAL | Status: DC
  Filled 2015-05-19: qty 1

## 2015-05-19 MED ORDER — ACETAMINOPHEN 500 MG PO TABS: 1000.0000 mg | ORAL_TABLET | Freq: Four times a day (QID) | ORAL | Status: AC

## 2015-05-19 MED ORDER — NALOXONE HCL 2 MG/2ML IJ SOSY
1.0000 ug/kg/h | PREFILLED_SYRINGE | INTRAVENOUS | Status: DC | PRN
  Filled 2015-05-19: qty 2

## 2015-05-19 NOTE — Lactation Note (Signed)
This note was copied from the chart of Felicia Hamilton CapriAmber Westerman. Lactation Consultation Note  Patient Name: Felicia [name] ZOXWR'UToday's Date: 05/19/2015 Reason for consult: Follow-up assessment;Breast/nipple pain Baby has been cluster feeding today and Mom reports nipple soreness. No breakdown noted but nipple/aerola are red. Mom has been using cradle hold. Encouraged to use football or cross cradle for deeper latch. Alternate positions with each feeding.  Care for sore nipples reviewed, advised to apply EBM, comfort gels given with instructions. Encouraged Mom to call with next feeding for LC assist.   Maternal Data    Feeding Feeding Type: Breast Fed Length of feed: 15 min  LATCH Score/Interventions          Comfort (Breast/Nipple): Filling, red/small blisters or bruises, mild/mod discomfort  Problem noted: Mild/Moderate discomfort Interventions (Mild/moderate discomfort): Hand massage;Hand expression;Comfort gels        Lactation Tools Discussed/Used Tools: Comfort gels   Consult Status Consult Status: Follow-up Date: 05/20/15 Follow-up type: In-patient    Alfred LevinsGranger, Arna Luis Ann 05/19/2015, 5:01 PM

## 2015-05-19 NOTE — Progress Notes (Signed)
Patient ID: Felicia HalterAmber J [name], female   DOB: 05-Aug-1980, 34 y.o.   MRN: [id number] Subjective: POD# 1 Information for the patient's newborn:  Felicia [name], Girl Felicia [name] [[id number]][[id number]]  female   Reports feeling very sore. Feeding: breast Patient reports tolerating PO.  Breast symptoms: none Pain minimally controlled with ibuprofen (OTC) and narcotic analgesics including Percocet Denies HA/SOB/C/P/N/V/dizziness. Flatus present. No BM. She reports vaginal bleeding as normal, without clots.  She is ambulating, urinating without difficult.     Objective:   VS:  Filed Vitals:   05/18/15 2231 05/19/15 0037 05/19/15 0230 05/19/15 0630  BP: 103/60 99/48 97/46  111/65  Pulse: 56 52 63 73  Temp: 97.4 F (36.3 C) 97.8 F (36.6 C) 98.4 F (36.9 C) 97.8 F (36.6 C)  TempSrc: Oral Oral Oral Oral  Resp: 18 16 16 18   Height:      Weight:      SpO2: 96%   94%     Intake/Output Summary (Last 24 hours) at 05/19/15 1017 Last data filed at 05/19/15 0530  Gross per 24 hour  Intake    900 ml  Output   5875 ml  Net  -4975 ml        Recent Labs  05/19/15 0535  WBC 6.9  HGB 11.0*  HCT 32.0*  PLT 115*     Blood type: A POS (11/10 0940)  Rubella: Immune    Physical Exam:   General: alert, cooperative, fatigued and no distress  CV: Regular rate and rhythm, S1S2 present or without murmur or extra heart sounds  Resp: clear  Abdomen: soft, nontender, normal bowel sounds  Incision: dry, intact and serous drainage present on 1/3 of Honeycomb dressing  Uterine Fundus: firm, 1 FB below umbilicus, nontender  Lochia: minimal  Ext: extremities normal, atraumatic, no cyanosis or edema and Homans sign is negative, no sign of DVT   Assessment/Plan: 34 y.o.   POD# 1.  S/P Cesarean Delivery.  Indications: Repeat                Principal Problem:   Postpartum care following repeat cesarean delivery (11/12) Active Problems:   Previous cesarean delivery, antepartum   GDM (gestational diabetes mellitus),  class A1  Doing well, stable.               Regular diet as tolerated Ambulate Routine post-op care Increase Motrin to 800 mg every 8 hrs Alternate ice and heat to neck/upper back area / pain possibly from poor posture while nursing / be sure to maintain good posture while nursing  Raelyn MoraAWSON, Randall Rampersad, M, MSN, CNM 05/19/2015, 10:17 AM

## 2015-05-20 ENCOUNTER — Encounter (HOSPITAL_COMMUNITY): Payer: Self-pay | Admitting: Obstetrics and Gynecology

## 2015-05-20 NOTE — Lactation Note (Signed)
This note was copied from the chart of Felicia Hamilton CapriAmber Aguado. Lactation Consultation Note  Patient Name: Felicia [name] WUJWJ'XToday's Date: 05/20/2015 Reason for consult: Follow-up assessment  With this mom of a term baby, now 5850 hours old. Mom last pregnancy was NICU twins, and she exclusively pumped and bottle fed. Mom seems somewhat anxioss with positioning the baby for a deep latch. The baby latches well with deep latch, Mom was trying to make a "breathing space" for the baby, and I explained that this was not necessary, and will pull her nipple between the baby's gums, and cause a shallow latch. Mom also was not keeping her hand on her breast the whole time. I explained how for the first couple of weeks, this helps the baby to maintain a deeper latch.  Mom then said her wrist was tired - I tried to have mom toke a deep breath and relax, and told her she was supporting the baby's back, but did not need to hold too tightly to the baby., while in football hold. I did place a roll under mom's hand.   Mom was concerned on how to know if the baby was getting enough to eat.I told her to feed with cues, explained cluster feeding, and told her to keep track of wet and dirty diapers for next few days, as a guideline for normal.  Mom knows to call for more questions/concerns.    Maternal Data    Feeding Feeding Type: Breast Fed Length of feed: 12 min (still feeding when I left the room)  LATCH Score/Interventions Latch: Grasps breast easily, tongue down, lips flanged, rhythmical sucking. Intervention(s): Skin to skin;Teach feeding cues;Waking techniques Intervention(s): Adjust position;Assist with latch;Breast massage  Audible Swallowing: A few with stimulation  Type of Nipple: Everted at rest and after stimulation  Comfort (Breast/Nipple): Filling, red/small blisters or bruises, mild/mod discomfort (nipples tender, no obviious breakdown noted)  Problem noted: Mild/Moderate  discomfort Interventions (Mild/moderate discomfort): Comfort gels  Hold (Positioning): Assistance needed to correctly position infant at breast and maintain latch. (I positioned mom and baby for football hold)  LATCH Score: 7  Lactation Tools Discussed/Used     Consult Status Consult Status: Follow-up Date: 05/21/15 Follow-up type: In-patient    Alfred LevinsLee, Matilde Pottenger Anne 05/20/2015, 12:00 PM

## 2015-05-20 NOTE — Op Note (Signed)
NAME:  Felicia [name], Felicia [name]              ACCOUNT NO.:  [id number]  MEDICHamilton CapriL RECORD NO.:  [id number]  LOCATION:  9129                          FACILITY:  [facility]  PHYSICIAN:  Maxie BetterSheronette Saleah Rishel, M.D.DATE OF BIRTH:  1980-12-13  DATE OF PROCEDURE:  05/18/2015 DATE OF DISCHARGE:                              OPERATIVE REPORT   PREOPERATIVE DIAGNOSES:  Previous cesarean section, term gestation, class A1 gestational diabetes.  PROCEDURES:  Repeat cesarean section, Kerr hysterotomy.  POSTOPERATIVE DIAGNOSES:  Previous cesarean section, term gestation, class A1 gestational diabetes.  ANESTHESIA:  Spinal.  SURGEON:  Maxie BetterSheronette Rahcel Shutes, M.D.  ASSISTANT:  Raelyn Moraolitta Dawson, CNM.  DESCRIPTION OF PROCEDURE:  Under adequate spinal anesthesia, the patient was placed in the supine position with a left lateral tilt.  She was sterilely prepped and draped in usual fashion.  An indwelling Foley catheter was sterilely placed.  A 0.25% Marcaine was injected along the previous Pfannenstiel skin incision.  A Pfannenstiel skin incision was then made, carried down to the rectus fascia.  Rectus fascia was opened transversely.  The rectus fascia was then bluntly and sharply dissected off the rectus muscle in superior and inferior fashion.  The rectus muscle was split in midline.  The parietal peritoneum was entered bluntly and extended.  The Alexis self-retaining retractor was then placed.  The vesicouterine peritoneum was opened transversely.  The bladder was bluntly dissected off the lower uterine segment.  A curvilinear low transverse uterine incision was then made and extended with bandage scissors.  Artificial rupture of membranes occurred.  Clear amniotic fluid noted.  Subsequent delivery of a live female from the right occiput transverse position with a cord around the neck x2, both of which were reducible was accomplished.  The baby was bulb suctioned in the abdomen.  Cord was clamped, cut.  The baby was  transferred to the awaiting pediatrician, who assigned Apgars of 9 and 9 at 1 and 5 minutes.  The placenta was manually removed.  Uterine cavity was cleaned of debris.  Uterine incision had no extension, was closed in 2 layers, the first layer with 0 Monocryl running locked stitch, second layer was imbricated using 0 Monocryl suture.  Normal tubes and ovaries were noted bilaterally.  The abdomen was irrigated and suctioned of debris.  The Alexis retractor was removed.  Interceed was placed in an inverted fashion over the lower uterine segment incision.  The parietal peritoneum was closed with 2-0 Vicryl.  The rectus fascia was closed with 0 Vicryl x2.  The subcutaneous areas were irrigated, small bleeders were cauterized.  Interrupted 2-0 plain sutures were placed, and the skin was approximated with 4-0 Vicryl subcuticular sutures.  SPECIMENS:  Placenta not sent to Pathology.  ESTIMATED BLOOD LOSS:  600 mL.  INTRAOPERATIVE FLUIDS:  2600 mL.  URINE OUTPUT:  150 mL.  COUNTS:  Sponge and instrument count x2 was correct.  COMPLICATIONS:  None.  DISPOSITION:  The patient tolerated the procedure well and was transferred to the recovery room in stable condition.     Maxie BetterSheronette Tanor Glaspy, M.D.     /MEDQ  D:  05/18/2015  T:  05/18/2015  Job:  [id number]

## 2015-05-20 NOTE — Progress Notes (Addendum)
POD # 2  Subjective: Pt reports feeling ok/ left shoulder pain/ Pain controlled with Motrin and Percocet Tolerating po/Voiding without problems/ No n/v/ Flatus present No CP or SOB Activity: ad lib Bleeding is light Newborn info:  Information for the patient's newborn:  Fredderick SeveranceMatthews, Girl [place] [[id number]][[id number]]  female   Feeding: breast  Objective: VS:  Filed Vitals:   05/19/15 0230 05/19/15 0630 05/19/15 1818 05/20/15 0500  BP: 97/46 111/65 106/60 120/71  Pulse: 63 73 75 92  Temp: 98.4 F (36.9 C) 97.8 F (36.6 C) 98.3 F (36.8 C) 97.6 F (36.4 C)  TempSrc: Oral Oral Oral   Resp: 16 18 18 18   Height:      Weight:      SpO2:  94%      I&O: Intake/Output      11/13 0701 - 11/14 0700 11/14 0701 - 11/15 0700   I.V. (mL/kg)     Total Intake(mL/kg)     Urine (mL/kg/hr) 2050 (1.1)    Blood     Total Output 2050     Net -2050            LABS:  Recent Labs  05/19/15 0535  WBC 6.9  HGB 11.0*  HCT 32.0*  PLT 115*    Blood type: --/--/A POS (11/10 0940) Rubella:   Immune   Physical Exam:  General: alert, cooperative and no distress CV: Regular rate and rhythm Resp: CTA bilaterally Abdomen: soft, nontender, normal bowel sounds Uterine Fundus: firm, below umbilicus, nontender Incision: Covered with Tegaderm and honeycomb dressing; no significant edema, bruising, or erythema; saturated w/drk brown drainage to 1/3 of honeycomb on right; well approximated with suture and steri strips Lochia: minimal Ext: edema trace BLE and Homans sign is negative, no sign of DVT   Assessment/: POD # 2/ G4P0123/ S/P C/Section d/t repeat  A1GDM, delivered Gestational thrombocytopenia, delivered Doing well  Plan: Change dressing today Warm liquids and ambulate to facilitate flatus and alleviate shoulder pain Continue routine post op orders Anticipate discharge home tomorrow   Signed: Donette LarryBHAMBRI, Elveta Rape, Dorris CarnesN, MSN, CNM 05/20/2015, 10:48 AM

## 2015-05-21 MED ORDER — OXYCODONE-ACETAMINOPHEN 5-325 MG PO TABS
1.0000 | ORAL_TABLET | ORAL | Status: DC | PRN
Start: 2015-05-21 — End: 2018-08-08

## 2015-05-21 MED ORDER — IBUPROFEN 800 MG PO TABS: 800.0000 mg | ORAL_TABLET | Freq: Three times a day (TID) | ORAL | Status: DC

## 2015-05-21 NOTE — Lactation Note (Signed)
This note was copied from the chart of Felicia Hamilton CapriAmber [name]. Lactation Consultation Note  Patient Name: Felicia [name] ZOXWR'UToday's Date: 05/21/2015 Reason for consult: Follow-up assessment   Follow up with mom and 70 hour old infant prior to D/C. Infant with 8 Bf for 10-40 minutes, 7 bottle feedings of EBM of 8-22 cc, 4 voids and 4 stools since birth. Infant with 7% weight loss since birth. Mom reports she has nipple soreness that is improving today as she is obtaining deeper latches. She is using comfort gels. Mom has a Medela PIS for personal use at home. Mom reports her milk is coming in today. Discussed allowing infant to BF 8-12 x in 24 hours at first feeding cues and to forego pumping if infant nurses well. Discussed that infant feeding well and pumping can lead to over supply. BF information reviewed in Taking Care of Baby and Me Booklet. [facility]ed information [facility]. Mom is aware of Support Groups, Phone # and OP Services. Encouraged her to call with questions/concerns. Infant with follow up Ped. appointment on Thursday.   Maternal Data Formula Feeding for Exclusion: No Has patient been taught Hand Expression?: Yes Does the patient have breastfeeding experience prior to this delivery?: Yes  Feeding Feeding Type: Bottle Fed - Breast Milk Nipple Type: Slow - flow  LATCH Score/Interventions                      Lactation Tools Discussed/Used WIC Program: No Pump Review: Setup, frequency, and cleaning;Milk Storage   Consult Status Consult Status: Complete Follow-up type: Call as needed    Ed BlalockSharon S Jamiel Goncalves 05/21/2015, 8:20 AM

## 2015-05-21 NOTE — Progress Notes (Signed)
POSTOPERATIVE DAY # 3 S/P CS - repeat  S:         Reports feeling better this am but some residual shoulder pain             Tolerating po intake / no nausea / no vomiting / + flatus / + BM this am             Bleeding is light             Pain controlled with motrin and percocet             Up ad lib / ambulatory/ voiding QS  Newborn breast feeding   O:  VS: BP 119/73 mmHg  Pulse 74  Temp(Src) 97.5 F (36.4 C) (Oral)  Resp 18  Ht 5\' 5"  (1.651 m)  Wt 75.297 kg (166 lb)  BMI 27.62 kg/m2  SpO2 94%  LMP 08/10/2014  Breastfeeding? Unknown   LABS:               Recent Labs  05/19/15 0535  WBC 6.9  HGB 11.0*  PLT 115*               Bloodtype: --/--/A POS (11/10 0940)  Rubella:     Immune             tdap and flu current                                       Physical Exam:             Alert and Oriented X3  Lungs: Clear and unlabored  Heart: regular rate and rhythm / no mumurs  Abdomen: soft, non-tender, non-distended, active BS             Fundus: firm, non-tender, U-1             Dressing intact honeycomb              Incision:  approximated with suture / no erythema / no ecchymosis / no drainage  Perineum: intact  Lochia: light  Extremities: 1+ pedal edema, no calf pain or tenderness, negative Homans / TED in place  A:        POD # 3 S/P CS            Gestational thrombocytopenia - plt > 100k             Gas pain - improving  P:        Routine postoperative care              DC home / WOB booklet / reviewed care @ home               Marlinda MikeBAILEY, Kyria Bumgardner CNM, MSN, [facility] 05/21/2015, 9:15 AM

## 2015-05-21 NOTE — Discharge Summary (Signed)
  POSTOPERATIVE DISCHARGE SUMMARY:  Patient ID: Felicia [name] MRN: [id number] DOB/AGE: 12/04/1980 34 y.o.  Admit date: 05/18/2015 Admission Diagnoses: 40.1 weeks / previous cesarean section - elective repeat / GDMa1  Discharge date:  05/21/2015 Discharge Diagnoses: POD 3 s/p cesarean section / Gestational Thrombocytopenia -resolving / GDMa1 - delivered  Prenatal history: G4P0123   EDC : 05/17/2015, by Last Menstrual Period  [facility]  Primary provider : Cousins Prenatal course complicated by previous CS / GDMa1  Prenatal Labs: ABO, Rh: --/--/A POS (11/10 0940) Antibody: NEG (11/10 0940) Rubella:   Immune RPR: Non Reactive (11/10 0940)  HBsAg:   Negative HIV:   NR  Medical / Surgical History :  Past medical history:  Past Medical History  Diagnosis Date  . Medical history non-contributory   . Gestational diabetes mellitus, antepartum   . Postpartum care following repeat cesarean delivery (11/12) 05/18/2015    Past surgical history:  Past Surgical History  Procedure Laterality Date  . No past surgeries    . Compound fracture of right elbow  Right compound fracture of right elbow  . Cesarean section N/A 02/27/2013    Procedure: CESAREAN SECTION;  Surgeon: Serita KyleSheronette A Cousins, MD;  Location: [facility];  Service: Obstetrics;  Laterality: N/A;  . Cesarean section N/A 05/18/2015    Procedure: Repeat CESAREAN SECTION;  Surgeon: Maxie BetterSheronette Cousins, MD;  Location: [facility];  Service: Obstetrics;  Laterality: N/A;  EDD: 05/20/15    Family History: History reviewed. No pertinent family history.  Social History:  reports that she quit smoking about 2 years ago. Her smoking use included Cigarettes. She has never used smokeless tobacco. She reports that she drinks alcohol. She reports that she does not use illicit drugs.  Allergies: Review of patient's allergies indicates no known allergies.   Procedures: Cesarean section delivery on 05/18/2015  with delivery of female newborn by Dr Cherly Hensenousins   See operative report for further details APGAR (1 MIN): 9   APGAR (5 MINS): 9    Postoperative / postpartum course:  Uncomplicated with discharge on POD 3  Discharge Instructions:  Discharged Condition: stable  Activity: pelvic rest and postoperative restrictions x 2   Diet: routine  Medications: Ibuprofen 600mg  PO Q 6 hours PRN pain                      Percocet 1-2 tablets PO Q 4 hours PRN pain                      Prenatal Vitamin DAILY PO  Wound Care: keep clean and dry / remove honeycomb POD 5 Postpartum Instructions: [facility] discharge booklet - instructions reviewed  Discharge to: Home  Follow up :   [facility] in 6 weeks for routine postpartum visit with Dr COusins                Signed: Marlinda MikeBAILEY, Williette Loewe CNM, MSN, [facility]NM 05/21/2015, 9:20 AM

## 2015-05-22 ENCOUNTER — Encounter (HOSPITAL_COMMUNITY): Payer: Self-pay | Admitting: *Deleted

## 2015-05-27 ENCOUNTER — Ambulatory Visit (INDEPENDENT_AMBULATORY_CARE_PROVIDER_SITE_OTHER): Payer: No Typology Code available for payment source | Admitting: Family Medicine

## 2015-05-27 ENCOUNTER — Ambulatory Visit (HOSPITAL_COMMUNITY)
Admission: RE | Admit: 2015-05-27 | Discharge: 2015-05-27 | Disposition: A | Discharge status: Home / Self Care | Payer: No Typology Code available for payment source | Source: Ambulatory Visit | Attending: Family Medicine | Admitting: Family Medicine

## 2015-05-27 ENCOUNTER — Telehealth: Payer: Self-pay

## 2015-05-27 VITALS — BP 120/74 | HR 91 | Temp 98.0°F | Resp 18 | Ht 65.0 in | Wt 150.0 lb

## 2015-05-27 DIAGNOSIS — M25512 Pain in left shoulder: Secondary | ICD-10-CM | POA: Diagnosis present

## 2015-05-27 MED ORDER — IOHEXOL 350 MG/ML SOLN
100.0000 mL | Freq: Once | INTRAVENOUS | Status: AC | PRN
  Administered 2015-05-27: 100 mL via INTRAVENOUS

## 2015-05-27 MED ORDER — PREDNISONE 20 MG PO TABS: ORAL_TABLET | ORAL | Status: DC

## 2015-05-27 NOTE — Telephone Encounter (Signed)
CT results were called in. All WNL. LMOM of normal results, to follow Dr. Cain SaupeL's plan and [facility] with any questions.

## 2015-05-27 NOTE — Progress Notes (Signed)
This chart was scribed for Elvina Sidle, MD by Stann Ore, medical scribe at [facility].The patient was seen in exam room 1 and the patient's care was started at 3:49 PM.  Patient ID: Felicia [name] MRN: [id number], DOB: 09/12/1980, 34 y.o. Date of Encounter: 05/27/2015  Primary Physician: No PCP Per Patient  Chief Complaint:  Chief Complaint  Patient presents with  . OTHER    post c-section, now have left pain shoulder, arm and chestx 1 week     HPI:  Felicia [name] is a 35 y.o. female who presents to [facility] complaining of left shoulder pain after c-section 1 week ago. She's been applying heat pad to the area for the past week too. But she feels that the pain is radiating down to her left arm. She has some pain in her left thigh. She denies neck pain, trouble breathing. Her OBGYN gave her ibuprofen for the pain but without much relief.  She does not recall any trauma to her left shoulder or arm   Her OBGYN, Dr. Maxie Better, wants to make sure she doesn't have a blood clot.   She has twins at home that are 33 years old.   Past Medical History  Diagnosis Date  . Medical history non-contributory   . Gestational diabetes mellitus, antepartum   . Postpartum care following repeat cesarean delivery (11/12) 05/18/2015     Home Meds: Prior to Admission medications   Medication Sig Start Date End Date Taking? Authorizing Provider  acetaminophen (TYLENOL) 500 MG tablet Take 500 mg by mouth every 6 (six) hours as needed for mild pain or headache.   Yes Historical Provider, MD  ibuprofen (ADVIL,MOTRIN) 800 MG tablet Take 1 tablet (800 mg total) by mouth 3 (three) times daily. 05/21/15  Yes Marlinda Mike, CNM  oxyCODONE-acetaminophen (PERCOCET/ROXICET) 5-325 MG tablet Take 1 tablet by mouth every 4 (four) hours as needed (for pain scale 4-7). 05/21/15  Yes Marlinda Mike, CNM    Allergies: No Known Allergies  Social History    Social History  . Marital Status: Married    Spouse Name: N/A  . Number of Children: N/A  . Years of Education: N/A   Occupational History  . Not on file.   Social History Main Topics  . Smoking status: Former Smoker    Types: Cigarettes    Quit date: 08/12/2012  . Smokeless tobacco: Never Used  . Alcohol Use: Yes  . Drug Use: No  . Sexual Activity: Yes   Other Topics Concern  . Not on file   Social History Narrative     Review of Systems: Constitutional: negative for fever, chills, night sweats, weight changes, or fatigue  HEENT: negative for vision changes, hearing loss, congestion, rhinorrhea, ST, epistaxis, or sinus pressure Cardiovascular: negative for chest pain or palpitations Respiratory: negative for hemoptysis, wheezing, shortness of breath, or cough Abdominal: negative for abdominal pain, nausea, vomiting, diarrhea, or constipation Dermatological: negative for rash Neurologic: negative for headache, dizziness, or syncope Musc: positive for arthralgia (left shoulder), myalgia (left arm, left thigh)  All other systems reviewed and are otherwise negative with the exception to those above and in the HPI.  Physical Exam: Blood pressure 120/74, pulse 91, temperature 98 F (36.7 C), temperature source Oral, resp. rate 18, height  (1.651 m), weight 150 lb (68.04 kg), last menstrual period 08/10/2014, SpO2 98 %, unknown if currently breastfeeding., Body mass index is 24.96 kg/(m^2). General: Well developed,  well nourished, in no acute distress. Head: Normocephalic, atraumatic, eyes without discharge, sclera non-icteric, nares are without discharge. Bilateral auditory canals clear, TM's are without perforation, pearly grey and translucent with reflective cone of light bilaterally. Oral cavity moist, posterior pharynx without exudate, erythema, peritonsillar abscess, or post nasal drip.  Neck: Supple. No thyromegaly. Full ROM. No lymphadenopathy. Lungs: Clear  bilaterally to auscultation without wheezes, rales, or rhonchi. Breathing is unlabored. Heart: RRR with S1 S2. No murmurs, rubs, or gallops appreciated. Abdomen: Soft, non-tender, non-distended with normoactive bowel sounds. No hepatomegaly. No rebound/guarding. No obvious abdominal masses. Msk:  Strength and tone normal for age; tender in biceps tendon, reproducible flexing arm at the elbow.  Mild tenderness left anterior thigh Extremities/Skin: Warm and dry. No clubbing or cyanosis. No edema. No rashes or suspicious lesions. Neuro: Alert and oriented X 3. Moves all extremities spontaneously. Gait is normal. CNII-XII grossly in tact. Psych:  Responds to questions appropriately with a normal affect.   Labs:  ASSESSMENT AND PLAN:  34 y.o. year old female with atypical and unexplained left shoulder pain 9 days after C/section and nursing her daughter.  She has some left anterior thigh pain as well This chart was scribed in my presence and reviewed by me personally.    ICD-9-CM ICD-10-CM   1. Left shoulder pain 719.41 M25.512 CT Angio Chest W/Cm &/Or Wo Cm     Signed, Elvina SidleKurt Pinkey Mcjunkin, MD    By signing my name below, I, Stann Oresung-Kai Tsai, attest that this documentation has been prepared under the direction and in the presence of Elvina SidleKurt Jannis Atkins, MD. Electronically Signed: Stann Oresung-Kai Tsai, Scribe. 05/27/2015 , 3:49 PM .  Signed, Elvina SidleKurt Kaide Gage, MD 05/27/2015 3:49 PM

## 2015-05-27 NOTE — Patient Instructions (Addendum)
GO OVER TO [facility] MAIN ENTRANCE.  GO TO REGISTRATION AND LET THEM KNOW THAT YOU ARE THERE FOR A CT SCAN AND THEY WILL LET YOU KNOW WHERE YOU NEEDED TO GO.  Most likely cause of this is tendinitis of the biceps. Will make sure this is not a clot by doing the CAT scan today and if it's negative you can start on the prednisone 20 mg 2 tablets daily with food

## 2016-08-30 DIAGNOSIS — S93402A Sprain of unspecified ligament of left ankle, initial encounter: Secondary | ICD-10-CM | POA: Diagnosis not present

## 2016-11-12 DIAGNOSIS — R102 Pelvic and perineal pain: Secondary | ICD-10-CM | POA: Diagnosis not present

## 2016-11-12 DIAGNOSIS — R350 Frequency of micturition: Secondary | ICD-10-CM | POA: Diagnosis not present

## 2016-11-12 DIAGNOSIS — R110 Nausea: Secondary | ICD-10-CM | POA: Diagnosis not present

## 2016-11-12 DIAGNOSIS — N890 Mild vaginal dysplasia: Secondary | ICD-10-CM | POA: Diagnosis not present

## 2018-01-20 LAB — OB RESULTS CONSOLE HIV ANTIBODY (ROUTINE TESTING): HIV: NONREACTIVE

## 2018-01-20 LAB — OB RESULTS CONSOLE ANTIBODY SCREEN: Antibody Screen: NEGATIVE

## 2018-01-20 LAB — OB RESULTS CONSOLE ABO/RH: RH Type: POSITIVE

## 2018-01-20 LAB — OB RESULTS CONSOLE RUBELLA ANTIBODY, IGM: RUBELLA: IMMUNE

## 2018-01-20 LAB — OB RESULTS CONSOLE RPR: RPR: NONREACTIVE

## 2018-01-20 LAB — OB RESULTS CONSOLE GC/CHLAMYDIA
Chlamydia: NEGATIVE
Gonorrhea: NEGATIVE

## 2018-01-20 LAB — OB RESULTS CONSOLE HEPATITIS B SURFACE ANTIGEN: Hepatitis B Surface Ag: NEGATIVE

## 2018-07-19 DIAGNOSIS — Z3685 Encounter for antenatal screening for Streptococcus B: Secondary | ICD-10-CM | POA: Diagnosis not present

## 2018-07-19 DIAGNOSIS — O09293 Supervision of pregnancy with other poor reproductive or obstetric history, third trimester: Secondary | ICD-10-CM | POA: Diagnosis not present

## 2018-07-19 DIAGNOSIS — Z3A34 34 weeks gestation of pregnancy: Secondary | ICD-10-CM | POA: Diagnosis not present

## 2018-07-19 DIAGNOSIS — Z3480 Encounter for supervision of other normal pregnancy, unspecified trimester: Secondary | ICD-10-CM | POA: Diagnosis not present

## 2018-07-19 DIAGNOSIS — Z3A35 35 weeks gestation of pregnancy: Secondary | ICD-10-CM | POA: Diagnosis not present

## 2018-07-26 ENCOUNTER — Other Ambulatory Visit: Payer: Self-pay | Admitting: Obstetrics and Gynecology

## 2018-07-27 DIAGNOSIS — O09293 Supervision of pregnancy with other poor reproductive or obstetric history, third trimester: Secondary | ICD-10-CM | POA: Diagnosis not present

## 2018-07-27 DIAGNOSIS — Z3A35 35 weeks gestation of pregnancy: Secondary | ICD-10-CM | POA: Diagnosis not present

## 2018-07-27 DIAGNOSIS — Z3685 Encounter for antenatal screening for Streptococcus B: Secondary | ICD-10-CM | POA: Diagnosis not present

## 2018-08-01 DIAGNOSIS — O09293 Supervision of pregnancy with other poor reproductive or obstetric history, third trimester: Secondary | ICD-10-CM | POA: Diagnosis not present

## 2018-08-01 DIAGNOSIS — Z3A36 36 weeks gestation of pregnancy: Secondary | ICD-10-CM | POA: Diagnosis not present

## 2018-08-05 ENCOUNTER — Telehealth (HOSPITAL_COMMUNITY): Payer: Self-pay | Admitting: *Deleted

## 2018-08-05 ENCOUNTER — Encounter (HOSPITAL_COMMUNITY): Payer: Self-pay | Admitting: *Deleted

## 2018-08-05 NOTE — Telephone Encounter (Signed)
Preadmission screen  

## 2018-08-08 ENCOUNTER — Telehealth (HOSPITAL_COMMUNITY): Payer: Self-pay | Admitting: *Deleted

## 2018-08-08 DIAGNOSIS — O09293 Supervision of pregnancy with other poor reproductive or obstetric history, third trimester: Secondary | ICD-10-CM | POA: Diagnosis not present

## 2018-08-08 DIAGNOSIS — Z3A37 37 weeks gestation of pregnancy: Secondary | ICD-10-CM | POA: Diagnosis not present

## 2018-08-08 NOTE — Telephone Encounter (Signed)
Preadmission screen  

## 2018-08-10 ENCOUNTER — Telehealth (HOSPITAL_COMMUNITY): Payer: Self-pay | Admitting: *Deleted

## 2018-08-10 NOTE — Telephone Encounter (Signed)
Preadmission screen  

## 2018-08-11 ENCOUNTER — Telehealth (HOSPITAL_COMMUNITY): Payer: Self-pay | Admitting: *Deleted

## 2018-08-11 NOTE — Telephone Encounter (Signed)
Preadmission screen  

## 2018-08-12 ENCOUNTER — Telehealth (HOSPITAL_COMMUNITY): Payer: Self-pay | Admitting: *Deleted

## 2018-08-12 ENCOUNTER — Encounter (HOSPITAL_COMMUNITY): Payer: Self-pay

## 2018-08-12 NOTE — Telephone Encounter (Signed)
Preadmission screen Message left on her voicemail and on her husband's voicemail

## 2018-08-15 DIAGNOSIS — Z3A38 38 weeks gestation of pregnancy: Secondary | ICD-10-CM | POA: Diagnosis not present

## 2018-08-15 DIAGNOSIS — O09293 Supervision of pregnancy with other poor reproductive or obstetric history, third trimester: Secondary | ICD-10-CM | POA: Diagnosis not present

## 2018-08-19 ENCOUNTER — Encounter (HOSPITAL_COMMUNITY)
Admission: RE | Admit: 2018-08-19 | Discharge: 2018-08-19 | Disposition: A | Discharge status: Home / Self Care | Payer: BLUE CROSS/BLUE SHIELD | Source: Ambulatory Visit | Attending: Obstetrics and Gynecology | Admitting: Obstetrics and Gynecology

## 2018-08-19 DIAGNOSIS — O9081 Anemia of the puerperium: Secondary | ICD-10-CM | POA: Diagnosis not present

## 2018-08-19 DIAGNOSIS — Z3A00 Weeks of gestation of pregnancy not specified: Secondary | ICD-10-CM | POA: Diagnosis not present

## 2018-08-19 DIAGNOSIS — D62 Acute posthemorrhagic anemia: Secondary | ICD-10-CM | POA: Diagnosis not present

## 2018-08-19 DIAGNOSIS — Z23 Encounter for immunization: Secondary | ICD-10-CM | POA: Diagnosis not present

## 2018-08-19 DIAGNOSIS — O34211 Maternal care for low transverse scar from previous cesarean delivery: Secondary | ICD-10-CM | POA: Diagnosis not present

## 2018-08-19 DIAGNOSIS — O24429 Gestational diabetes mellitus in childbirth, unspecified control: Secondary | ICD-10-CM | POA: Diagnosis not present

## 2018-08-19 DIAGNOSIS — Z3A39 39 weeks gestation of pregnancy: Secondary | ICD-10-CM | POA: Diagnosis not present

## 2018-08-19 DIAGNOSIS — Z87891 Personal history of nicotine dependence: Secondary | ICD-10-CM | POA: Diagnosis not present

## 2018-08-19 LAB — CBC
HCT: 39.7 % (ref 36.0–46.0)
Hemoglobin: 13.4 g/dL (ref 12.0–15.0)
MCH: 31.5 pg (ref 26.0–34.0)
MCHC: 33.8 g/dL (ref 30.0–36.0)
MCV: 93.2 fL (ref 80.0–100.0)
NRBC: 0 % (ref 0.0–0.2)
Platelets: 162 10*3/uL (ref 150–400)
RBC: 4.26 MIL/uL (ref 3.87–5.11)
RDW: 13.1 % (ref 11.5–15.5)
WBC: 6.5 10*3/uL (ref 4.0–10.5)

## 2018-08-19 LAB — TYPE AND SCREEN
ABO/RH(D): A POS
Antibody Screen: NEGATIVE

## 2018-08-19 NOTE — Patient Instructions (Signed)
Avonna CHANTAVIA KRAMARZ  08/19/2018   Your procedure is scheduled on:  08/22/2018  Enter through the Main Entrance of [facility] at 0530 AM.  Pick up the phone at the desk and dial [phone]  Call this number if you have problems the morning of [phone]  Remember:   Do not eat food:(After Midnight) Desps de medianoche.  Do not drink clear liquids: (After Midnight) Desps de medianoche.  Take these medicines the morning of surgery with A SIP OF WATER: none   Do not wear jewelry, make-up or nail polish.  Do not wear lotions, powders, or perfumes. Do not wear deodorant.  Do not shave 48 hours prior to surgery.  Do not bring valuables to the hospital.  [facility] is not   responsible for any belongings or valuables brought to the hospital.  Contacts, dentures or bridgework may not be worn into surgery.  Leave suitcase in the car. After surgery it may be brought to your room.  For patients admitted to the hospital, checkout time is 11:00 AM the day of              discharge.    N/A   Please read over the following fact sheets that you were given:   Surgical Site Infection Prevention

## 2018-08-20 LAB — RPR: RPR: NONREACTIVE

## 2018-08-21 NOTE — Anesthesia Preprocedure Evaluation (Addendum)
Anesthesia Evaluation  Patient identified by MRN, date of birth, ID band Patient awake    Reviewed: Allergy & Precautions, NPO status , Patient's Chart, lab work & pertinent test results  Airway Mallampati: II  TM Distance: >3 FB Neck ROM: Full    Dental   Pulmonary former smoker,    breath sounds clear to auscultation       Cardiovascular negative cardio ROS   Rhythm:Regular Rate:Normal     Neuro/Psych negative neurological ROS     GI/Hepatic negative GI ROS, Neg liver ROS,   Endo/Other  negative endocrine ROS  Renal/GU negative Renal ROS     Musculoskeletal   Abdominal   Peds  Hematology negative hematology ROS (+)   Anesthesia Other Findings   Reproductive/Obstetrics (+) Pregnancy                            Lab Results  Component Value Date   WBC 6.5 08/19/2018   HGB 13.4 08/19/2018   HCT 39.7 08/19/2018   MCV 93.2 08/19/2018   PLT 162 08/19/2018    Anesthesia Physical Anesthesia Plan  ASA: II  Anesthesia Plan: Spinal   Post-op Pain Management:    Induction:   PONV Risk Score and Plan: 3 and Treatment may vary due to age or medical condition, Ondansetron, Scopolamine patch - Pre-op and Dexamethasone  Airway Management Planned: Simple Face Mask and Natural Airway  Additional Equipment:   Intra-op Plan:   Post-operative Plan:   Informed Consent: I have reviewed the patients History and Physical, chart, labs and discussed the procedure including the risks, benefits and alternatives for the proposed anesthesia with the patient or authorized representative who has indicated his/her understanding and acceptance.       Plan Discussed with: CRNA  Anesthesia Plan Comments:        Anesthesia Quick Evaluation

## 2018-08-22 ENCOUNTER — Inpatient Hospital Stay (HOSPITAL_COMMUNITY): Payer: BLUE CROSS/BLUE SHIELD | Admitting: Anesthesiology

## 2018-08-22 ENCOUNTER — Inpatient Hospital Stay (HOSPITAL_COMMUNITY)
Admission: RE | Admit: 2018-08-22 | Discharge: 2018-08-25 | DRG: 787 | Disposition: A | Discharge status: Home / Self Care | Payer: BLUE CROSS/BLUE SHIELD | Attending: Obstetrics and Gynecology | Admitting: Obstetrics and Gynecology

## 2018-08-22 ENCOUNTER — Encounter (HOSPITAL_COMMUNITY): Payer: Self-pay

## 2018-08-22 ENCOUNTER — Encounter (HOSPITAL_COMMUNITY)
Admission: RE | Disposition: A | Discharge status: Home / Self Care | Payer: Self-pay | Source: Home / Self Care | Attending: Obstetrics and Gynecology

## 2018-08-22 ENCOUNTER — Other Ambulatory Visit: Payer: Self-pay

## 2018-08-22 DIAGNOSIS — O9902 Anemia complicating childbirth: Secondary | ICD-10-CM

## 2018-08-22 DIAGNOSIS — O34211 Maternal care for low transverse scar from previous cesarean delivery: Principal | ICD-10-CM | POA: Diagnosis present

## 2018-08-22 DIAGNOSIS — Z98891 History of uterine scar from previous surgery: Secondary | ICD-10-CM

## 2018-08-22 DIAGNOSIS — O24429 Gestational diabetes mellitus in childbirth, unspecified control: Secondary | ICD-10-CM | POA: Diagnosis not present

## 2018-08-22 DIAGNOSIS — O9081 Anemia of the puerperium: Secondary | ICD-10-CM | POA: Diagnosis not present

## 2018-08-22 DIAGNOSIS — D62 Acute posthemorrhagic anemia: Secondary | ICD-10-CM | POA: Diagnosis not present

## 2018-08-22 DIAGNOSIS — Z87891 Personal history of nicotine dependence: Secondary | ICD-10-CM | POA: Diagnosis not present

## 2018-08-22 DIAGNOSIS — Z3A00 Weeks of gestation of pregnancy not specified: Secondary | ICD-10-CM | POA: Diagnosis not present

## 2018-08-22 DIAGNOSIS — Z3A39 39 weeks gestation of pregnancy: Secondary | ICD-10-CM

## 2018-08-22 SURGERY — Surgical Case
Anesthesia: Spinal

## 2018-08-22 MED ORDER — LACTATED RINGERS IV SOLN
INTRAVENOUS | Status: DC
  Administered 2018-08-22 (×2): via INTRAVENOUS

## 2018-08-22 MED ORDER — WITCH HAZEL-GLYCERIN EX PADS: 1.0000 "application " | MEDICATED_PAD | CUTANEOUS | Status: DC | PRN

## 2018-08-22 MED ORDER — ONDANSETRON HCL 4 MG/2ML IJ SOLN
INTRAMUSCULAR | Status: AC
  Filled 2018-08-22: qty 2

## 2018-08-22 MED ORDER — MENTHOL 3 MG MT LOZG
1.0000 | LOZENGE | OROMUCOSAL | Status: DC | PRN
  Filled 2018-08-22: qty 9

## 2018-08-22 MED ORDER — SCOPOLAMINE 1 MG/3DAYS TD PT72
1.0000 | MEDICATED_PATCH | Freq: Once | TRANSDERMAL | Status: AC
  Administered 2018-08-22: 1.5 mg via TRANSDERMAL

## 2018-08-22 MED ORDER — DIPHENHYDRAMINE HCL 50 MG/ML IJ SOLN
12.5000 mg | INTRAMUSCULAR | Status: DC | PRN
  Administered 2018-08-22: 12.5 mg via INTRAVENOUS

## 2018-08-22 MED ORDER — FENTANYL CITRATE (PF) 100 MCG/2ML IJ SOLN
INTRAMUSCULAR | Status: AC
  Filled 2018-08-22: qty 2

## 2018-08-22 MED ORDER — KETOROLAC TROMETHAMINE 30 MG/ML IJ SOLN: 30.0000 mg | Freq: Four times a day (QID) | INTRAMUSCULAR | Status: AC | PRN

## 2018-08-22 MED ORDER — NALBUPHINE HCL 10 MG/ML IJ SOLN: 5.0000 mg | INTRAMUSCULAR | Status: DC | PRN

## 2018-08-22 MED ORDER — MEPERIDINE HCL 25 MG/ML IJ SOLN: 6.2500 mg | INTRAMUSCULAR | Status: DC | PRN

## 2018-08-22 MED ORDER — SODIUM CHLORIDE 0.9 % IR SOLN
Status: DC | PRN
  Administered 2018-08-22: 1

## 2018-08-22 MED ORDER — DIPHENHYDRAMINE HCL 25 MG PO CAPS
25.0000 mg | ORAL_CAPSULE | ORAL | Status: DC | PRN
  Filled 2018-08-22: qty 1

## 2018-08-22 MED ORDER — LACTATED RINGERS IV SOLN
INTRAVENOUS | Status: DC
  Administered 2018-08-22 (×2): via INTRAVENOUS

## 2018-08-22 MED ORDER — SODIUM CHLORIDE 0.9% FLUSH: 3.0000 mL | INTRAVENOUS | Status: DC | PRN

## 2018-08-22 MED ORDER — NALOXONE HCL 4 MG/10ML IJ SOLN
1.0000 ug/kg/h | INTRAVENOUS | Status: DC | PRN
  Filled 2018-08-22: qty 5

## 2018-08-22 MED ORDER — DIPHENHYDRAMINE HCL 25 MG PO CAPS: 25.0000 mg | ORAL_CAPSULE | Freq: Four times a day (QID) | ORAL | Status: DC | PRN

## 2018-08-22 MED ORDER — PRENATAL MULTIVITAMIN CH
1.0000 | ORAL_TABLET | Freq: Every day | ORAL | Status: DC
  Administered 2018-08-23: 1 via ORAL
  Filled 2018-08-22: qty 1

## 2018-08-22 MED ORDER — OXYTOCIN 10 UNIT/ML IJ SOLN
INTRAMUSCULAR | Status: AC
  Filled 2018-08-22: qty 4

## 2018-08-22 MED ORDER — PHENYLEPHRINE 8 MG IN D5W 100 ML (0.08MG/ML) PREMIX OPTIME
INJECTION | INTRAVENOUS | Status: AC
  Filled 2018-08-22: qty 100

## 2018-08-22 MED ORDER — BUPIVACAINE HCL (PF) 0.25 % IJ SOLN
INTRAMUSCULAR | Status: DC | PRN
  Administered 2018-08-22: 10 mL

## 2018-08-22 MED ORDER — SIMETHICONE 80 MG PO CHEW
80.0000 mg | CHEWABLE_TABLET | Freq: Three times a day (TID) | ORAL | Status: DC
  Administered 2018-08-23 – 2018-08-25 (×6): 80 mg via ORAL
  Filled 2018-08-22 (×6): qty 1

## 2018-08-22 MED ORDER — COCONUT OIL OIL: 1.0000 "application " | TOPICAL_OIL | Status: DC | PRN

## 2018-08-22 MED ORDER — SODIUM BICARBONATE 8.4 % IV SOLN
INTRAVENOUS | Status: DC | PRN
  Administered 2018-08-22 (×2): 4 mL via EPIDURAL
  Administered 2018-08-22: 2 mL via EPIDURAL

## 2018-08-22 MED ORDER — SIMETHICONE 80 MG PO CHEW
80.0000 mg | CHEWABLE_TABLET | ORAL | Status: DC
  Administered 2018-08-22 – 2018-08-24 (×3): 80 mg via ORAL
  Filled 2018-08-22 (×3): qty 1

## 2018-08-22 MED ORDER — FENTANYL CITRATE (PF) 100 MCG/2ML IJ SOLN
25.0000 ug | INTRAMUSCULAR | Status: DC | PRN
  Administered 2018-08-22 (×2): 25 ug via INTRAVENOUS
  Administered 2018-08-22 (×2): 50 ug via INTRAVENOUS

## 2018-08-22 MED ORDER — OXYCODONE HCL 5 MG PO TABS
5.0000 mg | ORAL_TABLET | Freq: Once | ORAL | Status: AC
  Administered 2018-08-22: 5 mg via ORAL

## 2018-08-22 MED ORDER — FENTANYL CITRATE (PF) 100 MCG/2ML IJ SOLN
INTRAMUSCULAR | Status: DC | PRN
  Administered 2018-08-22: 85 ug via INTRAVENOUS
  Administered 2018-08-22: 15 ug via INTRATHECAL

## 2018-08-22 MED ORDER — ONDANSETRON HCL 4 MG/2ML IJ SOLN
INTRAMUSCULAR | Status: DC | PRN
  Administered 2018-08-22: 4 mg via INTRAVENOUS

## 2018-08-22 MED ORDER — BUPIVACAINE HCL (PF) 0.25 % IJ SOLN
INTRAMUSCULAR | Status: AC
  Filled 2018-08-22: qty 20

## 2018-08-22 MED ORDER — MORPHINE SULFATE (PF) 0.5 MG/ML IJ SOLN
INTRAMUSCULAR | Status: AC
  Filled 2018-08-22: qty 10

## 2018-08-22 MED ORDER — LACTATED RINGERS IV SOLN
INTRAVENOUS | Status: DC | PRN
  Administered 2018-08-22: 08:00:00 via INTRAVENOUS

## 2018-08-22 MED ORDER — SIMETHICONE 80 MG PO CHEW: 80.0000 mg | CHEWABLE_TABLET | ORAL | Status: DC | PRN

## 2018-08-22 MED ORDER — NALOXONE HCL 0.4 MG/ML IJ SOLN
0.4000 mg | INTRAMUSCULAR | Status: DC | PRN
  Filled 2018-08-22: qty 1

## 2018-08-22 MED ORDER — SCOPOLAMINE 1 MG/3DAYS TD PT72
MEDICATED_PATCH | TRANSDERMAL | Status: AC
  Filled 2018-08-22: qty 1

## 2018-08-22 MED ORDER — OXYTOCIN 10 UNIT/ML IJ SOLN
INTRAVENOUS | Status: DC | PRN
  Administered 2018-08-22: 40 [IU] via INTRAVENOUS

## 2018-08-22 MED ORDER — DIPHENHYDRAMINE HCL 50 MG/ML IJ SOLN
INTRAMUSCULAR | Status: AC
  Filled 2018-08-22: qty 1

## 2018-08-22 MED ORDER — ACETAMINOPHEN 500 MG PO TABS
1000.0000 mg | ORAL_TABLET | Freq: Four times a day (QID) | ORAL | Status: AC
  Administered 2018-08-22 – 2018-08-23 (×4): 1000 mg via ORAL
  Filled 2018-08-22 (×4): qty 2

## 2018-08-22 MED ORDER — KETOROLAC TROMETHAMINE 30 MG/ML IJ SOLN
INTRAMUSCULAR | Status: AC
  Filled 2018-08-22: qty 1

## 2018-08-22 MED ORDER — CEFAZOLIN SODIUM-DEXTROSE 2-4 GM/100ML-% IV SOLN
2.0000 g | INTRAVENOUS | Status: AC
  Administered 2018-08-22: 2 g via INTRAVENOUS
  Filled 2018-08-22: qty 100

## 2018-08-22 MED ORDER — OXYTOCIN 40 UNITS IN NORMAL SALINE INFUSION - SIMPLE MED: 2.5000 [IU]/h | INTRAVENOUS | Status: AC

## 2018-08-22 MED ORDER — OXYCODONE HCL 5 MG PO TABS
5.0000 mg | ORAL_TABLET | ORAL | Status: DC | PRN
  Administered 2018-08-22: 5 mg via ORAL
  Administered 2018-08-23 – 2018-08-24 (×8): 10 mg via ORAL
  Administered 2018-08-24 – 2018-08-25 (×2): 5 mg via ORAL
  Administered 2018-08-25: 10 mg via ORAL
  Filled 2018-08-22 (×2): qty 2
  Filled 2018-08-22 (×4): qty 1
  Filled 2018-08-22 (×7): qty 2

## 2018-08-22 MED ORDER — SENNOSIDES-DOCUSATE SODIUM 8.6-50 MG PO TABS
2.0000 | ORAL_TABLET | ORAL | Status: DC
  Administered 2018-08-22 – 2018-08-24 (×3): 2 via ORAL
  Filled 2018-08-22 (×3): qty 2

## 2018-08-22 MED ORDER — ONDANSETRON HCL 4 MG/2ML IJ SOLN: 4.0000 mg | Freq: Three times a day (TID) | INTRAMUSCULAR | Status: DC | PRN

## 2018-08-22 MED ORDER — PHENYLEPHRINE 8 MG IN D5W 100 ML (0.08MG/ML) PREMIX OPTIME
INJECTION | INTRAVENOUS | Status: DC | PRN
  Administered 2018-08-22: 60 ug/min via INTRAVENOUS

## 2018-08-22 MED ORDER — NALBUPHINE HCL 10 MG/ML IJ SOLN: 5.0000 mg | Freq: Once | INTRAMUSCULAR | Status: DC | PRN

## 2018-08-22 MED ORDER — BUPIVACAINE HCL (PF) 0.25 % IJ SOLN
INTRAMUSCULAR | Status: AC
  Filled 2018-08-22: qty 10

## 2018-08-22 MED ORDER — DIBUCAINE 1 % RE OINT: 1.0000 "application " | TOPICAL_OINTMENT | RECTAL | Status: DC | PRN

## 2018-08-22 MED ORDER — DEXAMETHASONE SODIUM PHOSPHATE 10 MG/ML IJ SOLN
INTRAMUSCULAR | Status: DC | PRN
  Administered 2018-08-22: 4 mg via INTRAVENOUS

## 2018-08-22 MED ORDER — MORPHINE SULFATE (PF) 0.5 MG/ML IJ SOLN
INTRAMUSCULAR | Status: DC | PRN
  Administered 2018-08-22: .15 mg via INTRATHECAL
  Administered 2018-08-22: 1 mg via EPIDURAL

## 2018-08-22 MED ORDER — KETOROLAC TROMETHAMINE 30 MG/ML IJ SOLN
30.0000 mg | Freq: Four times a day (QID) | INTRAMUSCULAR | Status: AC | PRN
  Administered 2018-08-22 (×2): 30 mg via INTRAVENOUS
  Filled 2018-08-22: qty 1

## 2018-08-22 MED ORDER — DEXAMETHASONE SODIUM PHOSPHATE 4 MG/ML IJ SOLN
INTRAMUSCULAR | Status: AC
  Filled 2018-08-22: qty 1

## 2018-08-22 MED ORDER — SODIUM BICARBONATE 8.4 % IV SOLN
INTRAVENOUS | Status: DC | PRN
  Administered 2018-08-22: 20 mL via EPIDURAL

## 2018-08-22 MED ORDER — ZOLPIDEM TARTRATE 5 MG PO TABS: 5.0000 mg | ORAL_TABLET | Freq: Every evening | ORAL | Status: DC | PRN

## 2018-08-22 MED ORDER — BUPIVACAINE IN DEXTROSE 0.75-8.25 % IT SOLN
INTRATHECAL | Status: DC | PRN
  Administered 2018-08-22: 1.6 mL via INTRATHECAL

## 2018-08-22 MED ORDER — IBUPROFEN 800 MG PO TABS
800.0000 mg | ORAL_TABLET | Freq: Three times a day (TID) | ORAL | Status: DC
  Administered 2018-08-22 – 2018-08-25 (×8): 800 mg via ORAL
  Filled 2018-08-22 (×8): qty 1

## 2018-08-22 SURGICAL SUPPLY — 47 items
APL SKNCLS STERI-STRIP NONHPOA (GAUZE/BANDAGES/DRESSINGS) ×1
BARRIER ADHS 3X4 INTERCEED (GAUZE/BANDAGES/DRESSINGS) ×3 IMPLANT
BENZOIN TINCTURE PRP APPL 2/3 (GAUZE/BANDAGES/DRESSINGS) ×2 IMPLANT
BRR ADH 4X3 ABS CNTRL BYND (GAUZE/BANDAGES/DRESSINGS) ×1
CHLORAPREP W/TINT 26ML (MISCELLANEOUS) ×3 IMPLANT
CLAMP CORD UMBIL (MISCELLANEOUS) IMPLANT
CLOSURE WOUND 1/2 X4 (GAUZE/BANDAGES/DRESSINGS) ×1
CLOTH BEACON ORANGE TIMEOUT ST (SAFETY) ×3 IMPLANT
DECANTER SPIKE VIAL GLASS SM (MISCELLANEOUS) ×2 IMPLANT
DRAPE C SECTION CLR SCREEN (DRAPES) ×3 IMPLANT
DRSG OPSITE POSTOP 4X10 (GAUZE/BANDAGES/DRESSINGS) ×3 IMPLANT
ELECT REM PT RETURN 9FT ADLT (ELECTROSURGICAL) ×3
ELECTRODE REM PT RTRN 9FT ADLT (ELECTROSURGICAL) ×1 IMPLANT
EXTRACTOR VACUUM M CUP 4 TUBE (SUCTIONS) ×1 IMPLANT
EXTRACTOR VACUUM M CUP 4' TUBE (SUCTIONS) ×1
GLOVE BIOGEL PI IND STRL 7.0 (GLOVE) ×2 IMPLANT
GLOVE BIOGEL PI INDICATOR 7.0 (GLOVE) ×4
GLOVE ECLIPSE 6.5 STRL STRAW (GLOVE) ×3 IMPLANT
GOWN STRL REUS W/TWL LRG LVL3 (GOWN DISPOSABLE) ×6 IMPLANT
KIT ABG SYR 3ML LUER SLIP (SYRINGE) IMPLANT
NDL HYPO 25X5/8 SAFETYGLIDE (NEEDLE) IMPLANT
NEEDLE HYPO 22GX1.5 SAFETY (NEEDLE) ×5 IMPLANT
NEEDLE HYPO 25X5/8 SAFETYGLIDE (NEEDLE) IMPLANT
NS IRRIG 1000ML POUR BTL (IV SOLUTION) ×3 IMPLANT
PACK C SECTION WH (CUSTOM PROCEDURE TRAY) ×3 IMPLANT
PAD OB MATERNITY 4.3X12.25 (PERSONAL CARE ITEMS) ×3 IMPLANT
RTRCTR C-SECT PINK 25CM LRG (MISCELLANEOUS) IMPLANT
STRIP CLOSURE SKIN 1/2X4 (GAUZE/BANDAGES/DRESSINGS) ×1 IMPLANT
SUT CHROMIC GUT AB #0 18 (SUTURE) IMPLANT
SUT MNCRL 0 VIOLET CTX 36 (SUTURE) ×3 IMPLANT
SUT MON AB 2-0 SH 27 (SUTURE)
SUT MON AB 2-0 SH27 (SUTURE) IMPLANT
SUT MON AB 3-0 SH 27 (SUTURE)
SUT MON AB 3-0 SH27 (SUTURE) IMPLANT
SUT MON AB 4-0 PS1 27 (SUTURE) IMPLANT
SUT MONOCRYL 0 CTX 36 (SUTURE) ×6
SUT PLAIN 2 0 (SUTURE) ×3
SUT PLAIN 2 0 XLH (SUTURE) IMPLANT
SUT PLAIN ABS 2-0 CT1 27XMFL (SUTURE) IMPLANT
SUT VIC AB 0 CT1 36 (SUTURE) ×6 IMPLANT
SUT VIC AB 2-0 CT1 27 (SUTURE) ×3
SUT VIC AB 2-0 CT1 TAPERPNT 27 (SUTURE) ×1 IMPLANT
SUT VIC AB 4-0 PS2 27 (SUTURE) IMPLANT
SYR CONTROL 10ML LL (SYRINGE) ×5 IMPLANT
TOWEL OR 17X24 6PK STRL BLUE (TOWEL DISPOSABLE) ×3 IMPLANT
TRAY FOLEY W/BAG SLVR 14FR LF (SET/KITS/TRAYS/PACK) ×2 IMPLANT
WATER STERILE IRR 1000ML POUR (IV SOLUTION) ×3 IMPLANT

## 2018-08-22 NOTE — Lactation Note (Signed)
This note was copied from a baby's chart. Lactation Consultation Note  Patient Name: Felicia [name] ZOXWR'U Date: 08/22/2018 Reason for consult: Initial assessment;Term  25 hours old FT female who is being exclusively BF by her mother, she's a P3. She BF her twins (first pregnancy) for 2 months but it was mostly pumping and bottle. She BF her next baby for 24 months. Mom's feeding choice on admission was to do both, breast and formula, she may supplement at some point, but so far she's leaning towards breastfeeding and she's already double pumping. LC reviewed pump instructions with mom because she forgot to push the second button that puts the pump into a 15 minutes cycle and she probably pumped for +20 minutes, she voiced one of her nipples was a little sore. Reviewed prevention and treatment for sore nipples. She has a Medela DEBP at home from her last baby, but plans to order a new on through her insurance.  Mom was already pumping when entering the room, offered assistance with latch and she agreed to wake baby up to feed, she was asleep. GOB checked baby's diaper prior feeding, a meconium stool was documented. Mom requested LC assistance to feed baby 2 ml of colostrum with the curve tip syringe, baby took it up right away; her sucking was rhythmical and very coordinated. Then, LC took baby STS to mom's right breast and baby was able to latch almost right away, a few audible swallows were noted upon breast compressions. Mom was very pleased, but kept switching to typical cradle instead of cross cradle. Baby still nursing when exiting the room. Reviewed feeding cues, normal newborn behavior and cluster feeding.  Feeding plan:  1. Encouraged mom to feed baby STS 8-12 times/24 hours or sooner if feeding cues are present 2. Mom will continue pumping after feedings, PRN 4-6 times/24 hours and will continue feeding baby any amount of EBM she may get  BF brochure, BF resources and feeding diary were  reviewed. Mom reported all questions and concerns were answered, she's aware of LC services and will call PRN.  Maternal Data Formula Feeding for Exclusion: Yes Reason for exclusion: Mother's choice to formula and breast feed on admission Has patient been taught Hand Expression?: Yes Does the patient have breastfeeding experience prior to this delivery?: Yes  Feeding Feeding Type: Breast Fed  LATCH Score Latch: Grasps breast easily, tongue down, lips flanged, rhythmical sucking.  Audible Swallowing: A few with stimulation  Type of Nipple: Everted at rest and after stimulation  Comfort (Breast/Nipple): Soft / non-tender  Hold (Positioning): Assistance needed to correctly position infant at breast and maintain latch.  LATCH Score: 8  Interventions Interventions: Breast feeding basics reviewed;Assisted with latch;Skin to skin;Breast massage;Breast compression;Adjust position;Support pillows;DEBP  Lactation Tools Discussed/Used Tools: Pump Breast pump type: Double-Electric Breast Pump WIC Program: No Pump Review: Setup, frequency, and cleaning Initiated by:: RN Date initiated:: 08/22/18   Consult Status Consult Status: Follow-up Date: 08/23/18 Follow-up type: In-patient    Felicia [name] 08/22/2018, 5:55 PM

## 2018-08-22 NOTE — Anesthesia Postprocedure Evaluation (Signed)
Anesthesia Post Note  Patient: Felicia [name]  Procedure(s) Performed: Repeat CESAREAN SECTION (N/A )     Patient location during evaluation: PACU Anesthesia Type: Spinal and Epidural Level of consciousness: awake and alert Pain management: pain level controlled Vital Signs Assessment: post-procedure vital signs reviewed and stable Respiratory status: spontaneous breathing and respiratory function stable Cardiovascular status: blood pressure returned to baseline and stable Postop Assessment: spinal receding and epidural receding Anesthetic complications: no    Last Vitals:  Vitals:   08/22/18 1030 08/22/18 1115  BP: 114/80 132/86  Pulse: 78 61  Resp: 15 18  Temp:  36.9 C  SpO2: 100% 99%    Last Pain:  Vitals:   08/22/18 1115  TempSrc:   PainSc: 0-No pain   Pain Goal:                   Kennieth Rad

## 2018-08-22 NOTE — Transfer of Care (Signed)
Immediate Anesthesia Transfer of Care Note  Patient: Felicia [name]  Procedure(s) Performed: Repeat CESAREAN SECTION (N/A )  Patient Location: PACU  Anesthesia Type:Spinal and Epidural  Level of Consciousness: awake, alert  and oriented  Airway & Oxygen Therapy: Patient Spontanous Breathing  Post-op Assessment: Report given to RN and Post -op Vital signs reviewed and stable  Post vital signs: Reviewed and stable  Last Vitals:  Vitals Value Taken Time  BP 120/84 08/22/2018  9:09 AM  Temp    Pulse 72 08/22/2018  9:14 AM  Resp 18 08/22/2018  9:14 AM  SpO2 100 % 08/22/2018  9:14 AM  Vitals shown include unvalidated device data.  Last Pain:  Vitals:   08/22/18 0553  TempSrc: Oral         Complications: No apparent anesthesia complications

## 2018-08-22 NOTE — Anesthesia Procedure Notes (Signed)
Spinal  Patient location during procedure: OR Start time: 08/22/2018 7:27 AM End time: 08/22/2018 7:32 AM Staffing Anesthesiologist: Marcene Duos, MD Performed: anesthesiologist  Preanesthetic Checklist Completed: patient identified, site marked, surgical consent, pre-op evaluation, timeout performed, IV checked, risks and benefits discussed and monitors and equipment checked Spinal Block Patient position: sitting Prep: DuraPrep Patient monitoring: heart rate, cardiac monitor, continuous pulse ox and blood pressure Approach: midline Location: L3-4 Injection technique: single-shot Needle Needle type: Sprotte  Needle gauge: 24 G Needle length: 9 cm Assessment Sensory level: T4 Additional Notes CSF free flowing before during and after injection LA. Spinal only partially set up however and epidural placed for C-section delivery. See epidural note for full details of procedure.

## 2018-08-22 NOTE — Op Note (Signed)
NAME: Felicia [name], Felicia [name] MEDICAL RECORD [id number] ACCOUNT [id number] DATE OF BIRTH:August 26, 1980 FACILITY: [facility] LOCATION: [facility] PHYSICIAN:Johnrobert Foti A. Tamaka Sawin, MD  OPERATIVE REPORT  DATE OF PROCEDURE:  08/22/2018  PREOPERATIVE DIAGNOSIS:  Previous cesarean section x2, term gestation.  PROCEDURE:  Repeat cesarean section, Kerr hysterotomy.  POSTOPERATIVE DIAGNOSES:  Previous cesarean section x2, term gestation.  ANESTHESIA:  Failed spinal, epidural.  SURGEON:  Maxie Better, MD  ASSISTANT:  Marlinda Mike, CNM  DESCRIPTION OF PROCEDURE:  Under adequate epidural anesthesia after failed spinal, the patient was re-sterilely prepped and draped in the usual fashion.  An indwelling Foley catheter was already in place.  Marcaine 0.25% was injected along the previous Pfannenstiel skin incision site.  Pfannenstiel skin incision was then made, carried down to the rectus fascia.  The rectus fascia was opened transversely.  Rectus fascia was then bluntly and sharply dissected off the lower uterine segment in a superior  and inferior fashion.  The rectus muscle was split in the midline sharply, and the parietal peritoneum was incidentally opened at that time.  The parietal incision was extended superiorly and inferiorly.  A very thin lower uterine segment was noted.   Vesicouterine peritoneum was carefully opened transversely.  The bladder was gently displaced inferiorly with blunt dissection.  One stroke of the scalpel resulted in the artificial rupture of membranes and opening of the uterine cavity.  Blunt opening of the uterine cavity in a  cephalic and caudad manner was then done.  Initial attempt of delivery of a live female from the left occiput posterior position was unsuccessful.  Therefore, a vacuum was used to assist in delivery.  The baby was bulb suctioned on the abdomen.  Delayed cord clamp x1 minute was done.  Cord was subsequently clamped, cut.  The baby was transferred to the  awaiting pediatricians who assigned Apgars of 8 and 9 at one and five minutes.  The placenta which was posteriorly was spontaneous, intact, not sent to  pathology.  Uterine cavity was cleaned of debris.  Uterine incision with a thin lower inferior segment was closed in 2 layers.  The first layer 0 Monocryl running lock stitch, second layer imbricating using 0 Monocryl suture.  Normal tubes and ovaries  were noted laterally.  Her abdomen was irrigated and suctioned of debris.  Interceed in an inverted T fashion was placed over the lower uterine segment.  The parietal peritoneum was then closed with 2-0 Vicryl, and 30 mL of 1% Nesacaine was poured into the abdominal cavity.  The rectus fascia was closed with 0 Vicryl x2.  The subcutaneous area was irrigated, small bleeders cauterized.  Interrupted 2-0 plain sutures placed, and the skin approximated using 4-0 Vicryl subcuticular closure.  Benzoin and  Steri-Strips were then placed.  SPECIMEN:  Placenta not sent to pathology.  ESTIMATED BLOOD LOSS:  360 mL.  URINE OUTPUT:  100 mL clear yellow urine.  INTRAOPERATIVE FLUIDS:  2500 mL  COUNTS:  Sponge and instrument count x2 was correct.  COMPLICATIONS:  None.  DISPOSITION:  The patient tolerated the procedure well and was transferred to the recovery room in stable condition.  LN/NUANCE  D:08/22/2018 T:08/22/2018 JOB:[id number]

## 2018-08-22 NOTE — H&P (Signed)
Felicia [name] is a 38 y.o. female presenting for repeat C/S 2 39 1/7wk. Uncomplicated PNC OB History    Gravida  5   Para  2   Term  1   Preterm  1   AB  2   Living  3     SAB  1   TAB  1   Ectopic  0   Multiple  1   Live Births  3          Past Medical History:  Diagnosis Date  . Gestational diabetes mellitus, antepartum   . Medical history non-contributory   . Postpartum care following repeat cesarean delivery (11/12) 05/18/2015   Past Surgical History:  Procedure Laterality Date  . CESAREAN SECTION N/A 02/27/2013   Procedure: CESAREAN SECTION;  Surgeon: Serita Kyle, MD;  Location: [facility];  Service: Obstetrics;  Laterality: N/A;  . CESAREAN SECTION N/A 05/18/2015   Procedure: Repeat CESAREAN SECTION;  Surgeon: Maxie Better, MD;  Location: [facility];  Service: Obstetrics;  Laterality: N/A;  EDD: 05/20/15  . compound fracture of right elbow  Right compound fracture of right elbow  . NO PAST SURGERIES     Family History: family history includes Heart attack in her maternal grandfather. Social History:  reports that she quit smoking about 6 years ago. Her smoking use included cigarettes. She has never used smokeless tobacco. She reports current alcohol use. She reports that she does not use drugs.     Maternal Diabetes: No Genetic Screening: Normal Maternal Ultrasounds/Referrals: Abnormal:  Findings:   Isolated EIF (echogenic intracardiac focus) Fetal Ultrasounds or other Referrals:  None Maternal Substance Abuse:  No Significant Maternal Medications:  None Significant Maternal Lab Results:  Lab values include: Group B Strep negative Other Comments:  None  Review of Systems  All other systems reviewed and are negative.  Maternal Medical History:  Fetal activity: Perceived fetal activity is normal.    Prenatal complications: no prenatal complications Prenatal Complications - Diabetes: none.      Blood pressure (!) 131/92, pulse 85,  temperature 98 F (36.7 C), temperature source Oral, resp. rate 20, height 5\' 6"  (1.676 m), weight 79.4 kg, unknown if currently breastfeeding. Exam Physical Exam  Constitutional: She is oriented to person, place, and time. She appears well-developed and well-nourished.  HENT:  Head: Atraumatic.  Eyes: EOM are normal.  Neck: Neck supple.  Cardiovascular: Normal rate and regular rhythm.  Respiratory: Breath sounds normal.  GI: Soft.  Pfannenstiel skin incision  Musculoskeletal:        General: No edema.  Neurological: She is alert and oriented to person, place, and time.  Skin: Skin is warm and dry.    Prenatal labs: ABO, Rh: --/--/A POS (02/14 5859) Antibody: NEG (02/14 0955) Rubella: Immune (07/18 0000) RPR: Non Reactive (02/14 0955)  HBsAg: Negative (07/18 0000)  HIV: Non-reactive (07/18 0000)  GBS:   negative  Assessment/Plan: Previous C/S Term gestation P) repeat C/S. Risk of surgery includes infection, bleeding, injury to surrounding organ strucutres, internal scar tissue, possible need for blood transfusion and its risk. All ? answered   Felicia [name] A Felicia [name] 08/22/2018, 6:02 AM

## 2018-08-22 NOTE — Anesthesia Procedure Notes (Addendum)
Epidural Patient location during procedure: OR Start time: 08/22/2018 7:57 AM End time: 08/22/2018 8:30 AM  Staffing Anesthesiologist: Marcene Duos, MD Performed: anesthesiologist   Preanesthetic Checklist Completed: patient identified, site marked, surgical consent, pre-op evaluation, timeout performed, IV checked, risks and benefits discussed and monitors and equipment checked  Epidural Patient position: sitting Prep: site prepped and draped and DuraPrep Patient monitoring: continuous pulse ox and blood pressure Approach: midline Location: L3-L4 Injection technique: LOR air  Needle:  Needle type: Tuohy  Needle gauge: 17 G Needle length: 9 cm and 9 Needle insertion depth: 5 cm cm Catheter type: closed end flexible Catheter size: 19 Gauge Catheter at skin depth: 10 cm Test dose: negative  Assessment Sensory level: T6 Events: blood not aspirated, injection not painful, no injection resistance, negative IV test and no paresthesia

## 2018-08-22 NOTE — Brief Op Note (Signed)
08/22/2018  8:57 AM  PATIENT:  Felicia [name]  38 y.o. female  PRE-OPERATIVE DIAGNOSIS:  Previous Cesarean Section x2, term  POST-OPERATIVE DIAGNOSIS:  Previous Cesarean Section x 2, term  PROCEDURE:  Repeat C/S. Sharl Ma hysterotomy  SURGEON:  Surgeon(s) and Role:    * Maxie Better, MD - Primary  PHYSICIAN ASSISTANT:   ASSISTANTS: Marlinda Mike, CNM   ANESTHESIA:   epidural and failed spinal  EBL:  344 mL  Findings: live female LOP. Nl tubes and ovaries, thin LUS, post placenta BLOOD ADMINISTERED:none  DRAINS: none   LOCAL MEDICATIONS USED:  MARCAINE    and OTHER nesicaine( 28ml)  SPECIMEN:  No Specimen  DISPOSITION OF SPECIMEN:  N/A  COUNTS:  YES  TOURNIQUET:  * No tourniquets in log *  DICTATION: .Other Dictation: Dictation Number [id number]  PLAN OF CARE: Admit to inpatient   PATIENT DISPOSITION:  PACU - hemodynamically stable.   Delay start of Pharmacological VTE agent (>24hrs) due to surgical blood loss or risk of bleeding: no

## 2018-08-23 DIAGNOSIS — O9902 Anemia complicating childbirth: Secondary | ICD-10-CM

## 2018-08-23 LAB — CBC
HCT: 29.7 % — ABNORMAL LOW (ref 36.0–46.0)
Hemoglobin: 9.9 g/dL — ABNORMAL LOW (ref 12.0–15.0)
MCH: 31.6 pg (ref 26.0–34.0)
MCHC: 33.3 g/dL (ref 30.0–36.0)
MCV: 94.9 fL (ref 80.0–100.0)
NRBC: 0 % (ref 0.0–0.2)
Platelets: 143 10*3/uL — ABNORMAL LOW (ref 150–400)
RBC: 3.13 MIL/uL — AB (ref 3.87–5.11)
RDW: 13.3 % (ref 11.5–15.5)
WBC: 6.4 10*3/uL (ref 4.0–10.5)

## 2018-08-23 LAB — BIRTH TISSUE RECOVERY COLLECTION (PLACENTA DONATION)

## 2018-08-23 MED ORDER — POLYSACCHARIDE IRON COMPLEX 150 MG PO CAPS
150.0000 mg | ORAL_CAPSULE | Freq: Every day | ORAL | Status: DC
  Administered 2018-08-23 – 2018-08-25 (×2): 150 mg via ORAL
  Filled 2018-08-23 (×2): qty 1

## 2018-08-23 MED ORDER — MAGNESIUM OXIDE 400 (241.3 MG) MG PO TABS
400.0000 mg | ORAL_TABLET | Freq: Every day | ORAL | Status: DC
  Administered 2018-08-23 – 2018-08-25 (×2): 400 mg via ORAL
  Filled 2018-08-23 (×3): qty 1

## 2018-08-23 NOTE — Progress Notes (Signed)
CSW received consult for hx of Anxiety. CSW met with MOB to offer support and complete assessment.    CSW spoke with MOB and FOB at bedside. CSW asked that all parties in room step out so that CSW could speak with MOB in private, however MOB requested hat FOB stay in the room. CSW discussed with MOB the reason for visit, hx of anxiety. MOB immediately denied having any history of anxiety and reported that she had not been diagnosed with anxiety. MOB did express that in the past MOB experienced situational stressors while pregant with her now 37 year old twins. Since this experience, MOB denied having any anxiety during this pregnancy. MOB denied any history of postpartum depression with older children. MOB reported that she is a happy person. CSW assessed for safety, MOB denied SI and HI. MOB expressed having supports from husband and other relatives. MOB and FOB appeared to be attached and bonded with infant.   CSW noted no further needs at this time as MOB and FOB both denied having items needed to care for infant with no other concerns.    CSW provided education regarding the baby blues period vs. perinatal mood disorders, discussed treatment and gave resources for mental health follow up if concerns arise.  CSW recommends self-evaluation during the postpartum time period using the New Mom Checklist from Postpartum Progress and encouraged MOB to contact a medical professional if symptoms are noted at any time.   CSW provided review of Sudden Infant Death Syndrome (SIDS) precautions.   CSW identifies no further need for intervention and no barriers to discharge at this time.    Ervin Rothbauer S. Nylan Nakatani, MSW, LCSW-A  

## 2018-08-23 NOTE — Progress Notes (Signed)
Patient ID: Felicia [name], female   DOB: 1980-12-02, 38 y.o.   MRN: [id number] Subjective: POD# 1 Live born female  Birth Weight: 8 lb 9.4 oz (3895 g) APGAR: 8, 9  Newborn Delivery   Birth date/time:  08/22/2018 08:20:00 Delivery type:  C-Section, Vacuum Assisted Trial of labor:  No C-section categorization:  Repeat    Baby name: Mardene Celeste Delivering provider: COUSINS, Nena Jordan   Feeding: breast  Pain control at delivery: Spinal   Reports feeling tired, lots of pain in R upper shoulder, using heating pad for comfort. Has done minimal ambulation since surgery.  Patient reports tolerating PO.   Breast symptoms: none Pain controlled with PO meds Denies HA/SOB/C/P/N/V/dizziness. Flatus minimal. She reports vaginal bleeding as normal, without clots.  She is ambulating, urinating without difficulty.     Objective:   VS:    Vitals:   08/22/18 1819 08/22/18 2129 08/23/18 0220 08/23/18 0600  BP: (!) 110/58 110/72 (!) 102/58 112/62  Pulse: 68 62 76 85  Resp: 18 18 18 18   Temp: 98 F (36.7 C) 97.7 F (36.5 C) 98.3 F (36.8 C) 98.2 F (36.8 C)  TempSrc:  Oral Oral Oral  SpO2: 96% 98% 98% 98%  Weight:      Height:          Intake/Output Summary (Last 24 hours) at 08/23/2018 0940 Last data filed at 08/23/2018 4037 Gross per 24 hour  Intake 75 ml  Output 5423 ml  Net -5348 ml        Recent Labs    08/23/18 0516  WBC 6.4  HGB 9.9*  HCT 29.7*  PLT 143*     Blood type: --/--/A POS (02/14 5436)  Rubella: Immune (07/18 0000)  Vaccines: TDaP UTD         Flu    UTD   Physical Exam:  General: alert, cooperative and no distress CV: Regular rate and rhythm Resp: clear Abdomen: moderate gas distention, + BS Incision: clean, dry and intact Uterine Fundus: firm, below umbilicus, nontender Lochia: minimal Ext: no edema, redness or tenderness in the calves or thighs      Assessment/Plan: 38 y.o.   POD# 1. [id number]                  Principal Problem:  Postpartum care following cesarean delivery (2/17) Active Problems:   Previous cesarean section   Maternal anemia, with delivery  - start oral fe and mag ox  Doing well, stable.               Advance diet as tolerated Encourage rest when baby rests Breastfeeding support Encourage to ambulate in unit, warm fluid to increase gut motility Routine post-op care  Neta Mends, CNM, MSN 08/23/2018, 9:40 AM

## 2018-08-23 NOTE — Lactation Note (Signed)
This note was copied from a baby's chart. Lactation Consultation Note  Patient Name: Felicia [name] IONGE'X Date: 08/23/2018 Reason for consult: Follow-up assessment;Term;Nipple pain/trauma  F/U with P4 mom, baby is 75 hours old born @ 39.1wks. Mom has twin toddlers at home and a third toddler. Mom states she breastfed/pumped with her first pregnancy (twins) for a few months and stopped because "the pumping got to be too much." Mom states she breastfed her last child for over 2 years, until that child started to ask for her breast by name. Mom states she used the pump twice yesterday, but it was painful on the left side so she hasn't pumped today. Mom reports baby cluster fed during the night and has been latching well throughout the day. Mom holding the baby at time of LC visit, and infant displaying feeding cues. Mom attempting to satisfy infant with the pacifier and states she wants this baby to be soothed by a pacifier, as she has 3 other small children at home.  Offered to observe infant's latch and infant noted to be rooting to mom's breast during conversation. Mom with soft medium size breasts and large erect nipples. First latch uncomfortable for mom and demonstrated proper technique to release latch. Reinforced to mom to try to get all the nipple in addition to areola in baby's mouth during latch. At second latch, very little areola tissue visible during latch; brought this to mom's attention. Mom positioned baby at the breast using relaxed cradle hold with infant's neck angled into mom's elbow. Adjusted infant's position to align ears/shoulders/hips and mom reports increased comfort with suckling.  Discussed feeding 8-12 times in 24 hours and feed with feeding cues. Encouraged STS as much as possible, changing feeding positions and alternating breasts with each feeding. Mom states she has only fed in cradle position. Discussed football hold with mom.  Asked mom to describe pain with  pumping. Discussed proper flange fit on her nipple. Mom states she started out with 68mm but switched to 38mm which was more comfortable.  Encouraged to call out with any concerns.  Maternal Data Has patient been taught Hand Expression?: (asked to demonstrate to mom who states she has never had to hand express with her other children and does not need to do it now) Does the patient have breastfeeding experience prior to this delivery?: Yes  Feeding Feeding Type: Breast Fed  LATCH Score Latch: Grasps breast easily, tongue down, lips flanged, rhythmical sucking.  Audible Swallowing: Spontaneous and intermittent  Type of Nipple: Everted at rest and after stimulation  Comfort (Breast/Nipple): Filling, red/small blisters or bruises, mild/mod discomfort  Hold (Positioning): Assistance needed to correctly position infant at breast and maintain latch.  LATCH Score: 8  Interventions Interventions: Breast feeding basics reviewed;Assisted with latch;Skin to skin;Breast massage;Hand express;Adjust position;Support pillows;Position options;Expressed milk;Coconut oil;DEBP  Consult Status Consult Status: Follow-up Date: 08/24/18 Follow-up type: In-patient    Virgia Land 08/23/2018, 3:40 PM

## 2018-08-24 MED ORDER — ONDANSETRON 4 MG PO TBDP
4.0000 mg | ORAL_TABLET | ORAL | Status: DC | PRN
  Administered 2018-08-24 (×2): 4 mg via ORAL
  Filled 2018-08-24 (×3): qty 1

## 2018-08-24 NOTE — Progress Notes (Signed)
POSTOPERATIVE DAY # 2 S/P Repeat LTCS baby girl "Felicia [name]"   S:         Reports feeling not well - has been vomiting; vomited last night and then again starting at 4am.  Denies fever/chills/diarrhea.  Pt. Also reports her husband wasn't feeling well either. Children have also been sick, but no GI virus symptoms. Reports having a decreased appetite. Took Zofran OTD this morning and states it did not help.  Vomited Motrin dose this morning. States she did finally pass flatus twice this morning. States her shoulder pain has resolved, but still feels gas pain and feels like abdomen is more distended.              Tolerating po intake / + nausea / +vomiting / + flatus / no BM  Denies dizziness, SOB, or CP             Bleeding is light             Pain not adequately controlled due to the vomiting, but is controlled with Motrin and Oxycodone when able to keep it down              Up ad lib / ambulatory/ voiding QS  Newborn breast feeding - going well; latching well    O:  VS: BP 122/72 (BP Location: Right Arm)   Pulse 72   Temp 97.6 F (36.4 C) (Oral)   Resp 18   Ht 5\' 6"  (1.676 m)   Wt 79.4 kg   SpO2 99%   Breastfeeding Unknown   BMI 28.25 kg/m    LABS:               Recent Labs    08/23/18 0516  WBC 6.4  HGB 9.9*  PLT 143*               Bloodtype: --/--/A POS (02/14 6812)  Rubella: Immune (07/18 0000)                                                       Physical Exam:             Alert and Oriented X3  Lungs: Clear and unlabored  Heart: regular rate and rhythm / no murmurs  Abdomen: soft, non-tender, mild distention noted, but soft; hypoactive bowel sounds on the right side; active bowel sounds on the left side             Fundus: firm, non-tender, U-3             Dressing: honeycomb dressing with steri-strips c/d/i              Incision:  approximated with sutures / no erythema / no ecchymosis / no drainage  Perineum: intact  Lochia: small, no clots   Extremities: no edema,  no calf pain or tenderness  A:        POD # 2 S/P Repeat LTCS            Nausea and Vomiting    - Unclear etiology GI virus vs. Post-op n/v   - Clear liquids this morning and rest   - Due to difficulty passing flatus, hold on Zofran   - Call if unable to keep clear liquids down; discussed will need to restart IV for hydration and  pain control if unable to keep liquids/medications down   ABL Anemia    - stable, asymptomatic   Heating pad, warm liquids and ambulation to promote bowel motility  Routine postoperative care              See lactation today   Anticipate discharge home tomorrow   Carlean Jews, MSN, CNM [facility]

## 2018-08-25 MED ORDER — MAGNESIUM OXIDE 400 (241.3 MG) MG PO TABS: 400.0000 mg | ORAL_TABLET | Freq: Every day | ORAL | 0 refills | Status: DC

## 2018-08-25 MED ORDER — OXYCODONE HCL 5 MG PO TABS: 5.0000 mg | ORAL_TABLET | Freq: Four times a day (QID) | ORAL | 0 refills | Status: DC | PRN

## 2018-08-25 MED ORDER — IBUPROFEN 800 MG PO TABS: 800.0000 mg | ORAL_TABLET | Freq: Three times a day (TID) | ORAL | 0 refills | Status: DC

## 2018-08-25 MED ORDER — POLYSACCHARIDE IRON COMPLEX 150 MG PO CAPS: 150.0000 mg | ORAL_CAPSULE | Freq: Every day | ORAL | 0 refills | Status: DC

## 2018-08-25 NOTE — Progress Notes (Signed)
POSTOPERATIVE DAY # 3 S/P CS   S:         Reports feeling better             Tolerating po intake / no nausea / no vomiting / + flatus / no BM             Bleeding is light             Pain controlled with motrin and oxycodone             Up ad lib / ambulatory/ voiding QS  Newborn Breast  O:  VS: BP 107/70 (BP Location: Right Arm)   Pulse 76   Temp (!) 97.3 F (36.3 C) (Oral)   Resp 18   Ht 5\' 6"  (1.676 m)   Wt 79.4 kg   SpO2 99%   Breastfeeding Unknown   BMI 28.25 kg/m    LABS:              Recent Labs    08/23/18 0516  WBC 6.4  HGB 9.9*  PLT 143*               Bloodtype: --/--/A POS (02/14 2446)  Rubella: Immune (07/18 0000)                                 Physical Exam:             Alert and Oriented X3  Lungs: Clear and unlabored  Heart: regular rate and rhythm / no mumurs  Abdomen: soft, non-tender, non-distended              Fundus: firm, non-tender, U-1             Dressing intact              Incision:  approximated with Suture / no erythema / no ecchymosis / no drainage  Perineum: intact  Lochia: light  Extremities: no edema, no calf pain or tenderness, NEG Homans  A:        POD # 3 S/P CS            Nausea and vomiting resolved  P:        Routine postoperative care              DC home - [facility] booklet instructions reviewed   Marlinda Mike CNM, MSN, [facility] 08/25/2018, 8:53 AM

## 2018-08-25 NOTE — Lactation Note (Signed)
This note was copied from a baby's chart. Lactation Consultation Note  Patient Name: Felicia [name] RFVOH'K Date: 08/25/2018 Reason for consult: Follow-up assessment;Term Baby is 33 hours old and up 2% from birth weight.  Mom reports baby is latching well and now getting full quickly since milk is in.  Mom recently fed and pumped 90 mls from each breast for comfort.  We discussed only pumping for comfort and not to empty breasts.  Questions answered.  Lactation outpatient services and support reviewed and encouraged prn.  Maternal Data    Feeding Feeding Type: Breast Fed  LATCH Score                   Interventions    Lactation Tools Discussed/Used     Consult Status Consult Status: Complete Follow-up type: Call as needed    Huston Foley 08/25/2018, 10:22 AM

## 2018-08-25 NOTE — Discharge Summary (Signed)
OB Discharge Summary  Patient Name: Felicia [name] DOB: 01/25/1981 MRN: [id number]  Date of admission: 08/22/2018  Admitting diagnosis: Previous Cesarean Section x 2 Intrauterine pregnancy: [redacted]w[redacted]d       Date of discharge: 08/25/2018    Discharge diagnosis: Term Pregnancy Delivered     Prenatal history: [id number]   EDC : 08/28/2018, by Other Basis  Prenatal care at [facility]  Primary provider : Cousins Prenatal course complicated by previous CS x 2  Prenatal Labs: ABO, Rh: --/--/A POS (02/14 8768) Antibody: NEG (02/14 0955) Rubella: Immune (07/18 0000)  RPR: Non Reactive (02/14 0955)  HBsAg: Negative (07/18 0000)  HIV: Non-reactive (07/18 0000)                                   Hospital course:  Sceduled C/S   38 y.o. yo [id number] at [redacted]w[redacted]d was admitted to the hospital 08/22/2018 for scheduled cesarean section with the following indication:Elective Repeat. Membrane Rupture Time/Date: 8:19 AM ,08/22/2018   Patient delivered a Viable infant.08/22/2018 Details of operation can be found in separate operative note.   Patient had an uncomplicated postpartum course.  She is ambulating, tolerating a regular diet, passing flatus, and urinating well. Patient is discharged home in stable condition on  08/25/18 on POD #2        Delivering PROVIDER: COUSINS, SHERONETTE                                                            Complications: None  Newborn Data: Live born female  Birth Weight: 8 lb 9.4 oz (3895 g) APGAR: 8, 9  Newborn Delivery   Birth date/time:  08/22/2018 08:20:00 Delivery type:  C-Section, Vacuum Assisted Trial of labor:  No C-section categorization:  Repeat     Baby Feeding: Breast Disposition:home with mother  Post partum procedures:none  Labs: Lab Results  Component Value Date   WBC 6.4 08/23/2018   HGB 9.9 (L) 08/23/2018   HCT 29.7 (L) 08/23/2018   MCV 94.9 08/23/2018   PLT 143 (L) 08/23/2018   CMP Latest Ref Rng & Units  05/16/2015  Glucose 65 - 99 mg/dL 96  BUN 6 - 20 mg/dL 9  Creatinine 0.35 - 5.97 mg/dL 4.16  Sodium 384 - 536 mmol/L 134(L)  Potassium 3.5 - 5.1 mmol/L 3.6  Chloride 101 - 111 mmol/L 105  CO2 22 - 32 mmol/L 21(L)  Calcium 8.9 - 10.3 mg/dL 4.6(O)  Total Protein 6.0 - 8.3 g/dL -  Total Bilirubin 0.3 - 1.2 mg/dL -  Alkaline Phos 39 - 032 U/L -  AST 0 - 37 U/L -  ALT 0 - 35 U/L -      Physical Exam @ time of discharge:  Vitals:   08/24/18 0553 08/24/18 1300 08/24/18 2215 08/25/18 0547  BP: 122/72 133/83 112/76 107/70  Pulse: 72 (!) 103 94 76  Resp: 18 17  18   Temp: 97.6 F (36.4 C) (!) 97.4 F (36.3 C) (!) 97.5 F (36.4 C) (!) 97.3 F (36.3 C)  TempSrc: Oral Oral Oral Oral  SpO2: 99%  99%   Weight:      Height:  General: alert, cooperative and no distress Lochia: appropriate Uterine Fundus: firm Perineum: intact Incision: Healing well with no significant drainage Extremities: DVT Evaluation: No evidence of DVT seen on physical exam.   Discharge instructions:  "Baby and Me Booklet" and [facility] Booklet  Discharge Medications:  Allergies as of 08/25/2018   No Known Allergies     Medication List    TAKE these medications   acetaminophen 500 MG tablet Commonly known as:  TYLENOL Take 500 mg by mouth every 6 (six) hours as needed for mild pain or headache.   calcium carbonate 500 MG chewable tablet Commonly known as:  TUMS - dosed in mg elemental calcium Chew 1 tablet by mouth 3 (three) times daily as needed for indigestion or heartburn.   doxylamine (Sleep) 25 MG tablet Commonly known as:  UNISOM Take 25 mg by mouth at bedtime as needed for sleep.   esomeprazole 20 MG capsule Commonly known as:  NEXIUM Take 20 mg by mouth daily as needed (heartburn/indigestion.).   ibuprofen 800 MG tablet Commonly known as:  ADVIL,MOTRIN Take 1 tablet (800 mg total) by mouth every 8 (eight) hours.   iron polysaccharides 150 MG capsule Commonly known as:   NIFEREX Take 1 capsule (150 mg total) by mouth daily. Start taking on:  August 26, 2018   LUBRICATING EYE DROPS OP Place 2 drops into both eyes daily as needed (dry eyes).   magnesium oxide 400 (241.3 Mg) MG tablet Commonly known as:  MAG-OX Take 1 tablet (400 mg total) by mouth daily. Start taking on:  August 26, 2018   oxyCODONE 5 MG immediate release tablet Commonly known as:  Oxy IR/ROXICODONE Take 1 tablet (5 mg total) by mouth every 6 (six) hours as needed for moderate pain.   PRENATAL PO Take 1 tablet by mouth daily.   zolpidem 5 MG tablet Commonly known as:  AMBIEN Take 5 mg by mouth at bedtime as needed for sleep.            Discharge Care Instructions  (From admission, onward)         Start     Ordered   08/25/18 0000  Discharge wound care:    Comments:  Leave honeycomb in place for 5 days - remove if get wet in shower. Leave steri-strips in place x 2 weeks. Keep incision clean and dry   08/25/18 1039          Diet: routine diet  Activity: Advance as tolerated. Pelvic rest x 6 weeks.   Follow up:6 weeks    Signed: Marlinda Mike CNM, MSN, [facility] 08/25/2018, 10:40 AM

## 2018-09-08 DIAGNOSIS — R10819 Abdominal tenderness, unspecified site: Secondary | ICD-10-CM | POA: Diagnosis not present

## 2018-09-08 DIAGNOSIS — O9279 Other disorders of lactation: Secondary | ICD-10-CM | POA: Diagnosis not present

## 2018-09-08 DIAGNOSIS — N898 Other specified noninflammatory disorders of vagina: Secondary | ICD-10-CM | POA: Diagnosis not present

## 2018-09-08 DIAGNOSIS — R109 Unspecified abdominal pain: Secondary | ICD-10-CM | POA: Diagnosis not present

## 2018-10-03 DIAGNOSIS — Z30430 Encounter for insertion of intrauterine contraceptive device: Secondary | ICD-10-CM | POA: Diagnosis not present

## 2018-10-03 DIAGNOSIS — Z1151 Encounter for screening for human papillomavirus (HPV): Secondary | ICD-10-CM | POA: Diagnosis not present

## 2018-10-03 DIAGNOSIS — Z130 Encounter for screening for diseases of the blood and blood-forming organs and certain disorders involving the immune mechanism: Secondary | ICD-10-CM | POA: Diagnosis not present

## 2018-10-03 DIAGNOSIS — Z01419 Encounter for gynecological examination (general) (routine) without abnormal findings: Secondary | ICD-10-CM | POA: Diagnosis not present

## 2018-10-03 DIAGNOSIS — Z124 Encounter for screening for malignant neoplasm of cervix: Secondary | ICD-10-CM | POA: Diagnosis not present

## 2018-10-03 DIAGNOSIS — Z113 Encounter for screening for infections with a predominantly sexual mode of transmission: Secondary | ICD-10-CM | POA: Diagnosis not present

## 2018-11-08 DIAGNOSIS — Z30431 Encounter for routine checking of intrauterine contraceptive device: Secondary | ICD-10-CM | POA: Diagnosis not present

## 2019-05-09 DIAGNOSIS — M79645 Pain in left finger(s): Secondary | ICD-10-CM | POA: Diagnosis not present

## 2019-06-20 DIAGNOSIS — M79645 Pain in left finger(s): Secondary | ICD-10-CM | POA: Diagnosis not present

## 2019-07-05 ENCOUNTER — Ambulatory Visit: Payer: BC Managed Care – PPO | Attending: Internal Medicine

## 2019-07-05 DIAGNOSIS — Z20822 Contact with and (suspected) exposure to covid-19: Secondary | ICD-10-CM

## 2019-07-05 DIAGNOSIS — Z20828 Contact with and (suspected) exposure to other viral communicable diseases: Secondary | ICD-10-CM | POA: Diagnosis not present

## 2019-07-06 LAB — NOVEL CORONAVIRUS, NAA: SARS-CoV-2, NAA: NOT DETECTED

## 2020-01-30 ENCOUNTER — Emergency Department (HOSPITAL_COMMUNITY)
Admission: EM | Admit: 2020-01-30 | Discharge: 2020-01-31 | Disposition: A | Discharge status: Home / Self Care | Payer: BC Managed Care – PPO | Attending: Emergency Medicine | Admitting: Emergency Medicine

## 2020-01-30 ENCOUNTER — Other Ambulatory Visit: Payer: Self-pay

## 2020-01-30 ENCOUNTER — Encounter (HOSPITAL_COMMUNITY): Payer: Self-pay | Admitting: *Deleted

## 2020-01-30 DIAGNOSIS — R112 Nausea with vomiting, unspecified: Secondary | ICD-10-CM | POA: Insufficient documentation

## 2020-01-30 DIAGNOSIS — R1031 Right lower quadrant pain: Secondary | ICD-10-CM | POA: Insufficient documentation

## 2020-01-30 DIAGNOSIS — R109 Unspecified abdominal pain: Secondary | ICD-10-CM | POA: Diagnosis not present

## 2020-01-30 DIAGNOSIS — K6389 Other specified diseases of intestine: Secondary | ICD-10-CM | POA: Diagnosis not present

## 2020-01-30 DIAGNOSIS — R197 Diarrhea, unspecified: Secondary | ICD-10-CM | POA: Diagnosis not present

## 2020-01-30 LAB — URINALYSIS, ROUTINE W REFLEX MICROSCOPIC
Glucose, UA: NEGATIVE mg/dL
Ketones, ur: 40 mg/dL — AB
Leukocytes,Ua: NEGATIVE
Nitrite: NEGATIVE
Protein, ur: NEGATIVE mg/dL
Specific Gravity, Urine: >1.03 — ABNORMAL HIGH (ref 1.005–1.030)
pH: 5.5 (ref 5.0–8.0)

## 2020-01-30 LAB — COMPREHENSIVE METABOLIC PANEL
ALT: 25 U/L (ref 0–44)
AST: 25 U/L (ref 15–41)
Albumin: 3.9 g/dL (ref 3.5–5.0)
Alkaline Phosphatase: 53 U/L (ref 38–126)
Anion gap: 12 (ref 5–15)
BUN: <5 mg/dL — ABNORMAL LOW (ref 6–20)
CO2: 22 mmol/L (ref 22–32)
Calcium: 8.9 mg/dL (ref 8.9–10.3)
Chloride: 102 mmol/L (ref 98–111)
Creatinine, Ser: 0.62 mg/dL (ref 0.44–1.00)
GFR calc Af Amer: >60 mL/min (ref >60)
GFR calc non Af Amer: >60 mL/min (ref >60)
Glucose, Bld: 112 mg/dL — ABNORMAL HIGH (ref 70–99)
Potassium: 3.2 mmol/L — ABNORMAL LOW (ref 3.5–5.1)
Sodium: 136 mmol/L (ref 135–145)
Total Bilirubin: 1.2 mg/dL (ref 0.3–1.2)
Total Protein: 7 g/dL (ref 6.5–8.1)

## 2020-01-30 LAB — URINALYSIS, MICROSCOPIC (REFLEX)

## 2020-01-30 LAB — LIPASE, BLOOD: Lipase: 20 U/L (ref 11–51)

## 2020-01-30 LAB — CBC
HCT: 42.7 % (ref 36.0–46.0)
Hemoglobin: 14.5 g/dL (ref 12.0–15.0)
MCH: 32.9 pg (ref 26.0–34.0)
MCHC: 34 g/dL (ref 30.0–36.0)
MCV: 96.8 fL (ref 80.0–100.0)
Platelets: 142 10*3/uL — ABNORMAL LOW (ref 150–400)
RBC: 4.41 MIL/uL (ref 3.87–5.11)
RDW: 11.9 % (ref 11.5–15.5)
WBC: 5.2 10*3/uL (ref 4.0–10.5)
nRBC: 0 % (ref 0.0–0.2)

## 2020-01-30 LAB — I-STAT BETA HCG BLOOD, ED (MC, WL, AP ONLY): I-stat hCG, quantitative: <5 m[IU]/mL (ref <5)

## 2020-01-30 MED ORDER — SODIUM CHLORIDE 0.9% FLUSH
3.0000 mL | Freq: Once | INTRAVENOUS | Status: AC
  Administered 2020-01-31: 3 mL via INTRAVENOUS

## 2020-01-30 MED ORDER — ONDANSETRON 4 MG PO TBDP
4.0000 mg | ORAL_TABLET | Freq: Once | ORAL | Status: AC | PRN
  Administered 2020-01-30: 4 mg via ORAL
  Filled 2020-01-30: qty 1

## 2020-01-30 NOTE — ED Triage Notes (Signed)
Pt with onset of vomiting Sunday morning associated fevers (102). Went to UC prior was given IVF while there. Said she went home and rested, but still having vomiting and diarrhea.

## 2020-01-31 ENCOUNTER — Encounter (HOSPITAL_COMMUNITY): Payer: Self-pay

## 2020-01-31 ENCOUNTER — Emergency Department (HOSPITAL_COMMUNITY): Payer: BC Managed Care – PPO

## 2020-01-31 DIAGNOSIS — K6389 Other specified diseases of intestine: Secondary | ICD-10-CM | POA: Diagnosis not present

## 2020-01-31 MED ORDER — ONDANSETRON HCL 4 MG/2ML IJ SOLN
4.0000 mg | Freq: Once | INTRAMUSCULAR | Status: AC
  Administered 2020-01-31: 4 mg via INTRAVENOUS
  Filled 2020-01-31: qty 2

## 2020-01-31 MED ORDER — ONDANSETRON 4 MG PO TBDP
ORAL_TABLET | ORAL | 0 refills | Status: DC
Start: 2020-01-31 — End: 2022-08-06

## 2020-01-31 MED ORDER — IOHEXOL 300 MG/ML  SOLN
100.0000 mL | Freq: Once | INTRAMUSCULAR | Status: AC | PRN
  Administered 2020-01-31: 100 mL via INTRAVENOUS

## 2020-01-31 MED ORDER — SODIUM CHLORIDE 0.9 % IV BOLUS
1000.0000 mL | Freq: Once | INTRAVENOUS | Status: AC
  Administered 2020-01-31: 1000 mL via INTRAVENOUS

## 2020-01-31 NOTE — ED Notes (Signed)
Patient Alert and oriented to baseline. Stable and ambulatory to baseline. Patient verbalized understanding of the discharge instructions.  Patient belongings were taken by the patient.   

## 2020-01-31 NOTE — ED Provider Notes (Signed)
Patient CARE signed out to follow-up CT scan. Delay in CT scan results discussed with radiology and verbal report of results showed no bowel obstruction, normal appendix. Patient stable for outpatient follow-up with Zofran.  Kenton Kingfisher, MD 01/31/20 [phone]

## 2020-01-31 NOTE — ED Provider Notes (Signed)
[facility] EMERGENCY DEPARTMENT Provider Note   CSN: [id number] Arrival date & time: 01/30/20  1843    History Chief Complaint  Patient presents with  . Emesis    Felicia [name] is a 39 y.o. female.  The history is provided by the patient.  Emesis She started getting sick 4 days ago with nausea and vomiting.  That subsided in the next day she started having diarrhea.  Symptoms have persisted.  She went to urgent care yesterday where she received IV fluids and prescriptions for ondansetron and Lomotil.  She is not able to get the prescription for Lomotil filled.  She continues to have diarrhea and nausea.  She vomited after arriving in the ED last night.  She initially ran a fever of 102, but has not had a fever since then.  She is complaining of some crampy pain in the right side of her abdomen.  No one else at home has been sick.  There have been no known sick contacts.  History reviewed. No pertinent past medical history.  There are no problems to display for this patient.   Past Surgical History:  Procedure Laterality Date  . CESAREAN SECTION       OB History   No obstetric history on file.     No family history on file.  Social History   Tobacco Use  . Smoking status: Never Smoker  Substance Use Topics  . Alcohol use: Yes  . Drug use: Not on file    Home Medications Prior to Admission medications   Not on File    Allergies    Patient has no known allergies.  Review of Systems   Review of Systems  Gastrointestinal: Positive for vomiting.  All other systems reviewed and are negative.   Physical Exam Updated Vital Signs BP (!) 152/117 (BP Location: Right Arm)   Pulse 87   Temp 97.6 F (36.4 C) (Oral)   Resp 21   SpO2 100%   Physical Exam Vitals and nursing note reviewed.   38 year old female, resting comfortably and in no acute distress. Vital signs are significant for elevated blood pressure and borderline elevated  respiratory rate. Oxygen saturation is 100%, which is normal. Head is normocephalic and atraumatic. PERRLA, EOMI. Oropharynx is clear. Neck is nontender and supple without adenopathy or JVD. Back is nontender and there is no CVA tenderness. Lungs are clear without rales, wheezes, or rhonchi. Chest is nontender. Heart has regular rate and rhythm without murmur. Abdomen is soft, flat, with moderate tenderness in the right mid and lower abdomen.  There is no rebound or guarding.  There are no masses or hepatosplenomegaly and peristalsis is hypoactive. Extremities have no cyanosis or edema, full range of motion is present. Skin is warm and dry without rash. Neurologic: Mental status is normal, cranial nerves are intact, there are no motor or sensory deficits.  ED Results / Procedures / Treatments   Labs (all labs ordered are listed, but only abnormal results are displayed) Labs Reviewed  COMPREHENSIVE METABOLIC PANEL - Abnormal; Notable for the following components:      Result Value   Potassium 3.2 (*)    Glucose, Bld 112 (*)    BUN <5 (*)    All other components within normal limits  CBC - Abnormal; Notable for the following components:   Platelets 142 (*)    All other components within normal limits  URINALYSIS, ROUTINE W REFLEX MICROSCOPIC - Abnormal; Notable for the following  components:   APPearance HAZY (*)    Specific Gravity, Urine >1.030 (*)    Hgb urine dipstick SMALL (*)    Bilirubin Urine SMALL (*)    Ketones, ur 40 (*)    All other components within normal limits  URINALYSIS, MICROSCOPIC (REFLEX) - Abnormal; Notable for the following components:   Bacteria, UA MANY (*)    All other components within normal limits  LIPASE, BLOOD  I-STAT BETA HCG BLOOD, ED ([facility], [facility], [facility] ONLY)   Radiology No results found.  Procedures Procedures  Medications Ordered in ED Medications  sodium chloride flush (NS) 0.9 % injection 3 mL (has no administration in time range)  ondansetron  (ZOFRAN-ODT) disintegrating tablet 4 mg (4 mg Oral Given 01/30/20 2035)    ED Course  I have reviewed the triage vital signs and the nursing notes.  Pertinent labs & imaging results that were available during my care of the patient were reviewed by me and considered in my medical decision making (see chart for details).  MDM Rules/Calculators/A&P Nausea, vomiting, diarrhea suggestive of viral gastroenteritis.  However, with persistence of symptoms and development of right lower quadrant tenderness, need to consider possibility of appendicitis.  Labs were reassuring with normal WBC.  There is borderline low platelet count of no clinical significance.  Mild hypokalemia is present secondary to GI losses.  Urinalysis does show high specific gravity and presence of ketones.  Bacteria are present, but it is a contaminated specimen.  She will be given additional IV fluids, will send for CT of abdomen and pelvis to rule out appendicitis.  Old records are reviewed confirming urgent care visit yesterday.  CT scan is still pending.  Case is signed out to Dr. Jodi Mourning.  Final Clinical Impression(s) / ED Diagnoses Final diagnoses:  Nausea vomiting and diarrhea  RLQ abdominal pain    Rx / DC Orders ED Discharge Orders    None       Dione Booze, MD 01/31/20 [phone]

## 2020-01-31 NOTE — Discharge Instructions (Signed)
Use Zofran as needed for nausea and vomiting. Follow-up with your local doctor for new or worsening symptoms.

## 2020-02-23 DIAGNOSIS — D225 Melanocytic nevi of trunk: Secondary | ICD-10-CM | POA: Diagnosis not present

## 2020-02-23 DIAGNOSIS — D485 Neoplasm of uncertain behavior of skin: Secondary | ICD-10-CM | POA: Diagnosis not present

## 2020-02-23 DIAGNOSIS — D2262 Melanocytic nevi of left upper limb, including shoulder: Secondary | ICD-10-CM | POA: Diagnosis not present

## 2020-02-23 DIAGNOSIS — D2261 Melanocytic nevi of right upper limb, including shoulder: Secondary | ICD-10-CM | POA: Diagnosis not present

## 2020-09-25 DIAGNOSIS — H0289 Other specified disorders of eyelid: Secondary | ICD-10-CM | POA: Diagnosis not present

## 2020-11-15 DIAGNOSIS — Z1331 Encounter for screening for depression: Secondary | ICD-10-CM | POA: Diagnosis not present

## 2020-11-15 DIAGNOSIS — M2550 Pain in unspecified joint: Secondary | ICD-10-CM | POA: Diagnosis not present

## 2020-11-15 DIAGNOSIS — M25532 Pain in left wrist: Secondary | ICD-10-CM | POA: Diagnosis not present

## 2020-11-15 DIAGNOSIS — K52839 Microscopic colitis, unspecified: Secondary | ICD-10-CM | POA: Diagnosis not present

## 2020-11-15 DIAGNOSIS — B002 Herpesviral gingivostomatitis and pharyngotonsillitis: Secondary | ICD-10-CM | POA: Diagnosis not present

## 2020-11-15 DIAGNOSIS — M25571 Pain in right ankle and joints of right foot: Secondary | ICD-10-CM | POA: Diagnosis not present

## 2021-04-23 DIAGNOSIS — Z30431 Encounter for routine checking of intrauterine contraceptive device: Secondary | ICD-10-CM | POA: Diagnosis not present

## 2021-04-23 DIAGNOSIS — Z01419 Encounter for gynecological examination (general) (routine) without abnormal findings: Secondary | ICD-10-CM | POA: Diagnosis not present

## 2021-04-23 DIAGNOSIS — K529 Noninfective gastroenteritis and colitis, unspecified: Secondary | ICD-10-CM | POA: Diagnosis not present

## 2022-05-05 ENCOUNTER — Ambulatory Visit: Payer: Self-pay | Admitting: Family Medicine

## 2022-08-06 ENCOUNTER — Ambulatory Visit: Payer: 59 | Admitting: Family Medicine

## 2022-08-06 ENCOUNTER — Encounter: Payer: Self-pay | Admitting: Family Medicine

## 2022-08-06 VITALS — BP 130/84 | HR 91 | Temp 98.0°F | Resp 16 | Ht 66.0 in | Wt 136.0 lb

## 2022-08-06 DIAGNOSIS — R0789 Other chest pain: Secondary | ICD-10-CM | POA: Diagnosis not present

## 2022-08-06 DIAGNOSIS — Z8632 Personal history of gestational diabetes: Secondary | ICD-10-CM

## 2022-08-06 DIAGNOSIS — F419 Anxiety disorder, unspecified: Secondary | ICD-10-CM | POA: Diagnosis not present

## 2022-08-06 LAB — COMPREHENSIVE METABOLIC PANEL
ALT: 48 U/L — ABNORMAL HIGH (ref 0–35)
AST: 52 U/L — ABNORMAL HIGH (ref 0–37)
Albumin: 4.9 g/dL (ref 3.5–5.2)
Alkaline Phosphatase: 76 U/L (ref 39–117)
BUN: 8 mg/dL (ref 6–23)
CO2: 25 mEq/L (ref 19–32)
Calcium: 9.9 mg/dL (ref 8.4–10.5)
Chloride: 101 mEq/L (ref 96–112)
Creatinine, Ser: 0.6 mg/dL (ref 0.40–1.20)
GFR: 112.26 mL/min (ref >60.00)
Glucose, Bld: 89 mg/dL (ref 70–99)
Potassium: 4.9 mEq/L (ref 3.5–5.1)
Sodium: 138 mEq/L (ref 135–145)
Total Bilirubin: 0.9 mg/dL (ref 0.2–1.2)
Total Protein: 7.7 g/dL (ref 6.0–8.3)

## 2022-08-06 LAB — CBC WITH DIFFERENTIAL/PLATELET
Basophils Absolute: 0 10*3/uL (ref 0.0–0.1)
Basophils Relative: 0.5 % (ref 0.0–3.0)
Eosinophils Absolute: 0.1 10*3/uL (ref 0.0–0.7)
Eosinophils Relative: 1.6 % (ref 0.0–5.0)
HCT: 39.5 % (ref 36.0–46.0)
Hemoglobin: 13.6 g/dL (ref 12.0–15.0)
Lymphocytes Relative: 27.5 % (ref 12.0–46.0)
Lymphs Abs: 1.4 10*3/uL (ref 0.7–4.0)
MCHC: 34.4 g/dL (ref 30.0–36.0)
MCV: 102.2 fl — ABNORMAL HIGH (ref 78.0–100.0)
Monocytes Absolute: 0.3 10*3/uL (ref 0.1–1.0)
Monocytes Relative: 6.5 % (ref 3.0–12.0)
Neutro Abs: 3.2 10*3/uL (ref 1.4–7.7)
Neutrophils Relative %: 63.9 % (ref 43.0–77.0)
Platelets: 180 10*3/uL (ref 150.0–400.0)
RBC: 3.87 Mil/uL (ref 3.87–5.11)
RDW: 12.9 % (ref 11.5–15.5)
WBC: 5.1 10*3/uL (ref 4.0–10.5)

## 2022-08-06 LAB — LIPID PANEL
Cholesterol: 239 mg/dL — ABNORMAL HIGH (ref 0–200)
HDL: 100.7 mg/dL (ref >39.00)
LDL Cholesterol: 127 mg/dL — ABNORMAL HIGH (ref 0–99)
NonHDL: 138.76
Total CHOL/HDL Ratio: 2
Triglycerides: 58 mg/dL (ref 0.0–149.0)
VLDL: 11.6 mg/dL (ref 0.0–40.0)

## 2022-08-06 LAB — TSH: TSH: 1.44 u[IU]/mL (ref 0.35–5.50)

## 2022-08-06 LAB — HEMOGLOBIN A1C: Hgb A1c MFr Bld: 5.1 % (ref 4.6–6.5)

## 2022-08-06 MED ORDER — ESCITALOPRAM OXALATE 10 MG PO TABS: 10.0000 mg | ORAL_TABLET | Freq: Every day | ORAL | 1 refills | Status: DC

## 2022-08-06 NOTE — Patient Instructions (Signed)
Welcome to [facility]! It was a pleasure meeting you today.  As discussed, Please schedule a 1 month follow up visit today.  Take 1/2 tab daily of escitalopram for 1 week then whole tab.  PLEASE NOTE:  If you had any LAB tests please let us know if you have not heard back within a few days. You may see your results on MyChart before we have a chance to review them but we will give you a call once they are reviewed by [place]. If we ordered any REFERRALS today, please let us know if you have not heard from their office within the next week.  Let us know through MyChart if you are needing REFILLS, or have your pharmacy send [place] the request. You can also use MyChart to communicate with me or any office staff.  Please try these tips to maintain a healthy lifestyle:  Eat most of your calories during the day when you are active. Eliminate processed foods including packaged sweets (pies, cakes, cookies), reduce intake of potatoes, white bread, white pasta, and white rice. Look for whole grain options, oat flour or almond flour.  Each meal should contain half fruits/vegetables, one quarter protein, and one quarter carbs (no bigger than a computer mouse).  Cut down on sweet beverages. This includes juice, soda, and sweet tea. Also watch fruit intake, though this is a healthier sweet option, it still contains natural sugar! Limit to 3 servings daily.  Drink at least 1 glass of water with each meal and aim for at least 8 glasses per day  Exercise at least 150 minutes every week.

## 2022-08-06 NOTE — Progress Notes (Signed)
Labs look good except liver tests are elevated-you really need to stop the alcohol as we discussed.  We will recheck this when you come back next month.  Cholesterol is acceptable as the good 1 is good, and the bad 1 is less than 130.

## 2022-08-06 NOTE — Progress Notes (Signed)
New Patient Office Visit  Subjective:  Patient ID: Felicia [name], female    DOB: 06-Mar-1981  Age: 42 y.o. MRN: [id number]  CC:  Chief Complaint  Patient presents with   Establish Care    Establish Care    HPI [name] presents for new pt  Chest pain and sob-center of chest for 1 mo.  Anxious-arms will feel heavy.  Has to rest. Checking bp's and 149/111.  140-150/100's.  Has checked husband's bp and fine. .  Had been checking tid.  Past 1 mo.  Never when active.  More when anx. Can get lightheaded.hands can be shakey.  Vomited in car one day-42yo was misbehaving.  Got sweaty/hot and vomited while driving. Anxiety-no SI.  Gets nervous. Hard to sleep,husb travels.  Drinks 1-2 to help.doesn't stay asleep.  Some night sweats.    Has iud so spots intermitt.  H/o GDM-night sweats and occ during day.  Tired so not really exercising.    Past Medical History:  Diagnosis Date   Gestational diabetes mellitus, antepartum    Medical history non-contributory    Postpartum care following repeat cesarean delivery (11/12) 05/18/2015    Past Surgical History:  Procedure Laterality Date   CESAREAN SECTION N/A 02/27/2013   Procedure: CESAREAN SECTION;  Surgeon: Marvene Staff, MD;  Location: [facility];  Service: Obstetrics;  Laterality: N/A;   CESAREAN SECTION N/A 05/18/2015   Procedure: Repeat CESAREAN SECTION;  Surgeon: Servando Salina, MD;  Location: [facility];  Service: Obstetrics;  Laterality: N/A;  EDD: 05/20/15   CESAREAN SECTION N/A 08/22/2018   Procedure: Repeat CESAREAN SECTION;  Surgeon: Servando Salina, MD;  Location: [facility];  Service: Obstetrics;  Laterality: N/A;  EDD: 08/28/18   CESAREAN SECTION     compound fracture of right elbow  Right compound fracture of right elbow   NO PAST SURGERIES      Family History  Problem Relation Age of Onset   Alcohol abuse Father    Drug abuse Father    Heart attack Maternal Grandfather    High Cholesterol Maternal  Grandfather    Stroke Maternal Grandfather     Social History   Socioeconomic History   Marital status: Married    Spouse name: Not on file   Number of children: 5   Years of education: Not on file   Highest education level: Not on file  Occupational History   Not on file  Tobacco Use   Smoking status: Former    Packs/day: 0.25    Types: Cigarettes    Quit date: 2010    Years since quitting: 14.0    Passive exposure: Past   Smokeless tobacco: Never  Vaping Use   Vaping Use: Never used  Substance and Sexual Activity   Alcohol use: Yes    Alcohol/week: 6.0 - 8.0 standard drinks of alcohol    Types: 6 - 8 Glasses of wine per week   Drug use: No   Sexual activity: Yes  Other Topics Concern   Not on file  Social History Narrative   1 set of twins   homemaker   Social Determinants of Health   Financial Resource Strain: Low Risk  (08/12/2018)   Overall Financial Resource Strain (CARDIA)    Difficulty of Paying Living Expenses: Not hard at all  Food Insecurity: No Food Insecurity (08/12/2018)   Hunger Vital Sign    Worried About Running Out of Food in the Last Year: Never true  Ran Out of Food in the Last Year: Never true  Transportation Needs: Unknown (08/12/2018)   PRAPARE - Hydrologist (Medical): No    Lack of Transportation (Non-Medical): Not on file  Physical Activity: Not on file  Stress: Not on file  Social Connections: Not on file  Intimate Partner Violence: Not on file    ROS  ROS: Didn't review as a lot going on  Objective:   Today's Vitals: BP 130/84 (BP Location: Right Arm, Patient Position: Sitting, Cuff Size: Normal)   Pulse 91   Temp 98 F (36.7 C) (Oral)   Resp 16   Ht 5\' 6"  (1.676 m)   Wt 136 lb (61.7 kg)   SpO2 100%   BMI 21.95 kg/m   Physical Exam  Gen: WDWN NAD wf HEENT: NCAT, conjunctiva not injected, sclera nonicteric  NECK:  supple, no thyromegaly, no nodes, no carotid bruits CARDIAC: RRR, S1S2+, no  murmur. DP 2+B LUNGS: CTAB. No wheezes ABDOMEN:  BS+, soft, NTND, No HSM, no masses EXT:  no edema MSK: no gross abnormalities.  NEURO: A&O x3.  CN II-XII intact.  PSYCH: normal mood. Good eye contact .  Anxious  EKG-NSR, no st changes.  Shortened PR.  No delta waves.   Assessment & Plan:   Problem List Items Addressed This Visit   None Visit Diagnoses     Other chest pain    -  Primary   Relevant Orders   EKG 12-Lead (Completed)   Comprehensive metabolic panel (Completed)   Lipid panel (Completed)   CBC with Differential/Platelet (Completed)   History of gestational diabetes       Relevant Orders   Comprehensive metabolic panel (Completed)   Hemoglobin A1c (Completed)   TSH (Completed)   Lipid panel (Completed)   Anxiety       Relevant Medications   escitalopram (LEXAPRO) 10 MG tablet   Other Relevant Orders   TSH (Completed)     1.  Atypical chest pain-new for about a month.  EKG is reassuring.  Seems to occur more when she gets stressed.  Will check CMP, lipid, CBC.  Suspect this is due to anxiety.  Will treat as below 2.  History of gestational diabetes-having some night sweats-which could be hypoglycemia, anxiety, early menopause, thyroid, other.  Check CBC, A1c, TSH, lipids.  Advised to eat small meals frequently.  Advised to eat a high-protein snack before bed.  Advised to decrease alcohol. 3.  Anxiety-patient is not keen on treatment for anxiety, however, I think it is causing a lot of medical problems at this time.  I think her chest pain is more due to anxiety.  Sweats may or may not be anxiety.  Await workup from above.  Check TSH.  Start Lexapro 10 mg daily-side effects discussed.  Advised this may help her sleep as well and she needs to stop drinking alcohol for that reason.  Follow-up in 3 to 4 weeks.  Outpatient Encounter Medications as of 08/06/2022  Medication Sig   escitalopram (LEXAPRO) 10 MG tablet Take 1 tablet (10 mg total) by mouth daily.    [DISCONTINUED] acetaminophen (TYLENOL) 500 MG tablet Take 500 mg by mouth every 6 (six) hours as needed for mild pain or headache.   [DISCONTINUED] calcium carbonate (TUMS - DOSED IN MG ELEMENTAL CALCIUM) 500 MG chewable tablet Chew 1 tablet by mouth 3 (three) times daily as needed for indigestion or heartburn.   [DISCONTINUED] Carboxymethylcellul-Glycerin (LUBRICATING EYE DROPS OP) Place  2 drops into both eyes daily as needed (dry eyes).   [DISCONTINUED] doxylamine, Sleep, (UNISOM) 25 MG tablet Take 25 mg by mouth at bedtime as needed for sleep.   [DISCONTINUED] esomeprazole (NEXIUM) 20 MG capsule Take 20 mg by mouth daily as needed (heartburn/indigestion.).   [DISCONTINUED] ibuprofen (ADVIL,MOTRIN) 800 MG tablet Take 1 tablet (800 mg total) by mouth every 8 (eight) hours.   [DISCONTINUED] iron polysaccharides (NIFEREX) 150 MG capsule Take 1 capsule (150 mg total) by mouth daily.   [DISCONTINUED] magnesium oxide (MAG-OX) 400 (241.3 Mg) MG tablet Take 1 tablet (400 mg total) by mouth daily.   [DISCONTINUED] ondansetron (ZOFRAN ODT) 4 MG disintegrating tablet 4mg  ODT q4 hours prn nausea/vomit   [DISCONTINUED] oxyCODONE (OXY IR/ROXICODONE) 5 MG immediate release tablet Take 1 tablet (5 mg total) by mouth every 6 (six) hours as needed for moderate pain.   [DISCONTINUED] Prenatal Vit-Fe Fumarate-FA (PRENATAL PO) Take 1 tablet by mouth daily.   [DISCONTINUED] zolpidem (AMBIEN) 5 MG tablet Take 5 mg by mouth at bedtime as needed for sleep.   No facility-administered encounter medications on file as of 08/06/2022.    Follow-up: Return in about 4 weeks (around 09/03/2022) for anxiety.   Wellington Hampshire, MD

## 2022-09-07 ENCOUNTER — Ambulatory Visit: Payer: 59 | Admitting: Family Medicine

## 2022-09-21 ENCOUNTER — Encounter: Payer: Self-pay | Admitting: Family Medicine

## 2022-09-21 ENCOUNTER — Ambulatory Visit: Payer: 59 | Admitting: Family Medicine

## 2022-09-21 VITALS — BP 140/90 | HR 75 | Temp 98.5°F | Ht 66.0 in | Wt 140.0 lb

## 2022-09-21 DIAGNOSIS — F419 Anxiety disorder, unspecified: Secondary | ICD-10-CM | POA: Diagnosis not present

## 2022-09-21 DIAGNOSIS — R7989 Other specified abnormal findings of blood chemistry: Secondary | ICD-10-CM

## 2022-09-21 MED ORDER — BUSPIRONE HCL 10 MG PO TABS: 10.0000 mg | ORAL_TABLET | Freq: Two times a day (BID) | ORAL | 1 refills | Status: DC

## 2022-09-21 MED ORDER — ESCITALOPRAM OXALATE 20 MG PO TABS: 20.0000 mg | ORAL_TABLET | Freq: Every day | ORAL | 1 refills | Status: AC

## 2022-09-21 MED ORDER — HYDROXYZINE HCL 10 MG PO TABS: 10.0000 mg | ORAL_TABLET | Freq: Every evening | ORAL | 1 refills | Status: DC | PRN

## 2022-09-21 NOTE — Progress Notes (Signed)
Subjective:     Patient ID: Felicia [name], female    DOB: 1980/11/12, 42 y.o.   MRN: [id number]  Chief Complaint  Patient presents with   Follow-up    1 month follow-up, on lexapro, still having anxiety Would like to discuss xanax Went to UC for blood in urine Has Colonoscopy on Friday    HPI  Anxiety-on lexapro-still some anxiety.feels meds helping.  Still a lot of shaking/upset.  Got rx for xanax 1mg  from UC-helps to relax.  Hands were shaking. A lot of insomnia and the xanax helps.   Rectal bleeding 2 wks ago.  Has colitis, passed blood clots.  Went to UC.  Also blood in urine-abx helped some ETOH-has cut down some, but too much on weekends.  Will do labs in 1 mo.  Health Maintenance Due  Topic Date Due   FOOT EXAM  Never done   OPHTHALMOLOGY EXAM  Never done   Diabetic kidney evaluation - Urine ACR  Never done   Hepatitis C Screening  Never done   DTaP/Tdap/Td (1 - Tdap) Never done   PAP SMEAR-Modifier  Never done    Past Medical History:  Diagnosis Date   Colitis    Gestational diabetes mellitus, antepartum    Medical history non-contributory    Postpartum care following repeat cesarean delivery (11/12) 05/18/2015    Past Surgical History:  Procedure Laterality Date   CESAREAN SECTION N/A 02/27/2013   Procedure: CESAREAN SECTION;  Surgeon: Marvene Staff, MD;  Location: [facility];  Service: Obstetrics;  Laterality: N/A;   CESAREAN SECTION N/A 05/18/2015   Procedure: Repeat CESAREAN SECTION;  Surgeon: Servando Salina, MD;  Location: [facility];  Service: Obstetrics;  Laterality: N/A;  EDD: 05/20/15   CESAREAN SECTION N/A 08/22/2018   Procedure: Repeat CESAREAN SECTION;  Surgeon: Servando Salina, MD;  Location: [facility];  Service: Obstetrics;  Laterality: N/A;  EDD: 08/28/18   CESAREAN SECTION     compound fracture of right elbow  Right compound fracture of right elbow   NO PAST SURGERIES      Outpatient Medications Prior to Visit  Medication  Sig Dispense Refill   ALPRAZolam (XANAX) 1 MG tablet Take 1 mg by mouth at bedtime.     valACYclovir (VALTREX) 500 MG tablet Take by mouth.     escitalopram (LEXAPRO) 10 MG tablet Take 1 tablet (10 mg total) by mouth daily. 30 tablet 1   No facility-administered medications prior to visit.    No Known Allergies ROS neg/noncontributory except as noted HPI/below      Objective:     BP (!) 140/90   Pulse 75   Temp 98.5 F (36.9 C) (Temporal)   Ht 5\' 6"  (1.676 m)   Wt 140 lb (63.5 kg)   SpO2 98%   Breastfeeding No   BMI 22.60 kg/m  Wt Readings from Last 3 Encounters:  09/21/22 140 lb (63.5 kg)  08/06/22 136 lb (61.7 kg)  08/22/18 175 lb (79.4 kg)    Physical Exam   Gen: WDWN NAD HEENT: NCAT, conjunctiva not injected, sclera nonicteric NECK:  supple, no thyromegaly, no nodes, no carotid bruits CARDIAC: RRR, S1S2+, MSK: no gross abnormalities.  NEURO: A&O x3.  CN II-XII intact.  PSYCH: anxious mood. Good eye contact     Assessment & Plan:   Problem List Items Addressed This Visit   None Visit Diagnoses     Anxiety    -  Primary   Relevant Medications  ALPRAZolam (XANAX) 1 MG tablet   escitalopram (LEXAPRO) 20 MG tablet   hydrOXYzine (ATARAX) 10 MG tablet   busPIRone (BUSPAR) 10 MG tablet   Elevated LFTs          Anxiety-chronic.  Better, but not controlled on lexapro 10mg .  Increase to 20mg .  Try to avoid xanax.  Try hydroxyzine 10-30 mg at hs for anx/slee/  also, can add buspar 5-10mg  bid prn or regualrly.    Elevated LFT's-pt working on decreasing ETOH-will repeat LFT's 1 mo.  F/u 1 mo  Meds ordered this encounter  Medications   escitalopram (LEXAPRO) 20 MG tablet    Sig: Take 1 tablet (20 mg total) by mouth daily.    Dispense:  90 tablet    Refill:  1   hydrOXYzine (ATARAX) 10 MG tablet    Sig: Take 1 tablet (10 mg total) by mouth at bedtime as needed.    Dispense:  30 tablet    Refill:  1   busPIRone (BUSPAR) 10 MG tablet    Sig: Take 1 tablet  (10 mg total) by mouth 2 (two) times daily.    Dispense:  60 tablet    Refill:  1    Wellington Hampshire, MD

## 2022-09-21 NOTE — Patient Instructions (Signed)
Try to avoid the xanax Try the hydoxyzine in eve to relax and sleep-1-3 tabs.  Increase the lexapro to 20mg (1/2 twice/day) Try 1/2 buspar once-twice/day as needed for anxiety.  Can do regularly as well

## 2022-09-25 ENCOUNTER — Other Ambulatory Visit: Payer: Self-pay | Admitting: Gastroenterology

## 2022-09-25 DIAGNOSIS — R1032 Left lower quadrant pain: Secondary | ICD-10-CM

## 2022-09-25 LAB — HM COLONOSCOPY

## 2022-10-01 ENCOUNTER — Encounter: Payer: Self-pay | Admitting: Gastroenterology

## 2022-10-01 ENCOUNTER — Other Ambulatory Visit: Payer: Self-pay | Admitting: Gastroenterology

## 2022-10-01 DIAGNOSIS — R1032 Left lower quadrant pain: Secondary | ICD-10-CM

## 2022-10-02 ENCOUNTER — Ambulatory Visit
Admission: RE | Admit: 2022-10-02 | Discharge: 2022-10-02 | Disposition: A | Discharge status: Home / Self Care | Payer: 59 | Source: Ambulatory Visit | Attending: Gastroenterology | Admitting: Gastroenterology

## 2022-10-02 ENCOUNTER — Other Ambulatory Visit: Payer: Self-pay | Admitting: Family Medicine

## 2022-10-02 DIAGNOSIS — R1032 Left lower quadrant pain: Secondary | ICD-10-CM

## 2022-10-02 MED ORDER — IOPAMIDOL (ISOVUE-370) INJECTION 76%
80.0000 mL | Freq: Once | INTRAVENOUS | Status: AC | PRN
  Administered 2022-10-02: 80 mL via INTRAVENOUS

## 2022-10-06 ENCOUNTER — Telehealth: Payer: Self-pay | Admitting: Family Medicine

## 2022-10-06 NOTE — Telephone Encounter (Signed)
Patient sent message to Admin in basket instead of to PCP. Message is as follows below:    Hey, I made an appointment for the 12th. But I was wondering what I need to do to transition into another antidepressant? I know you told me to not stop it all at once. I just want to do things the right way. I've just noticed more weight gain and after doing a little research, I saw that it could be common. Would I be a candidate for Wellbutrin?  Thanks,  Murphy Oil

## 2022-10-08 ENCOUNTER — Other Ambulatory Visit (HOSPITAL_COMMUNITY): Payer: Self-pay | Admitting: Gastroenterology

## 2022-10-08 DIAGNOSIS — R935 Abnormal findings on diagnostic imaging of other abdominal regions, including retroperitoneum: Secondary | ICD-10-CM

## 2022-10-16 ENCOUNTER — Ambulatory Visit: Payer: 59 | Admitting: Family Medicine

## 2022-10-26 ENCOUNTER — Ambulatory Visit: Payer: 59 | Admitting: Family Medicine

## 2022-10-29 ENCOUNTER — Encounter (HOSPITAL_COMMUNITY)
Admission: RE | Admit: 2022-10-29 | Discharge: 2022-10-29 | Disposition: A | Discharge status: Home / Self Care | Payer: 59 | Source: Ambulatory Visit | Attending: Gastroenterology | Admitting: Gastroenterology

## 2022-10-29 DIAGNOSIS — R935 Abnormal findings on diagnostic imaging of other abdominal regions, including retroperitoneum: Secondary | ICD-10-CM | POA: Insufficient documentation

## 2022-10-29 MED ORDER — TECHNETIUM TC 99M SULFUR COLLOID
2.1000 | Freq: Once | INTRAVENOUS | Status: AC | PRN
  Administered 2022-10-29: 2.1 via ORAL

## 2022-11-04 ENCOUNTER — Ambulatory Visit: Payer: 59 | Admitting: Family Medicine

## 2022-11-11 ENCOUNTER — Encounter: Payer: Self-pay | Admitting: Family Medicine

## 2022-11-11 ENCOUNTER — Ambulatory Visit: Payer: 59 | Admitting: Family Medicine

## 2022-11-11 VITALS — BP 122/80 | HR 88 | Temp 98.6°F | Resp 16 | Ht 66.0 in | Wt 140.2 lb

## 2022-11-11 DIAGNOSIS — K582 Mixed irritable bowel syndrome: Secondary | ICD-10-CM | POA: Diagnosis not present

## 2022-11-11 DIAGNOSIS — F419 Anxiety disorder, unspecified: Secondary | ICD-10-CM | POA: Diagnosis not present

## 2022-11-11 MED ORDER — BUPROPION HCL ER (XL) 150 MG PO TB24: 150.0000 mg | ORAL_TABLET | Freq: Every day | ORAL | 1 refills | Status: AC

## 2022-11-11 MED ORDER — HYDROXYZINE PAMOATE 25 MG PO CAPS: 25.0000 mg | ORAL_CAPSULE | Freq: Three times a day (TID) | ORAL | 0 refills | Status: DC | PRN

## 2022-11-11 NOTE — Patient Instructions (Addendum)
Lexapro-1/2 tab.    Start Wellbutrin in the morning  Hydroxyzine pamoate at bed for sleep  busPar twice daily if needed but if Wellbutrin worse anxiety, then do buspar 1/2 tab twice/day.

## 2022-11-11 NOTE — Progress Notes (Signed)
Subjective:     Patient ID: Felicia [name], female    DOB: 1980-07-08, 42 y.o.   MRN: [id number]  Chief Complaint  Patient presents with   Follow-up    4 week follow-up on moods Upped lexapro, may have been too much, anxiety medication, not doing that great, possibly switch medication Has GI issues going on    HPI  Moods-anxiety.  Increased Lexapro to 20 mg-still getting too anxious-and since increased almost not caring enough-housework suffering.  Concerned about weight gain which stresses/depresses her more.  Not motivated to do things.  Feeling tired a lot.  , atarax 10 mg-not helping sleep, and buspar 10 mg twice daily as needed so not taking regularly.  Feeling really anxious when having to be places(overwhelmed).    Husband concerned about some of the changes. Wondering if Wellbutrin is an option.  Sweating a lot since dose increased.  On menses now and really sweating. No SI Gastroenterology symptoms-colitis and diarrhea/constipation, hemorrhoids.  Did CT, colonoscopy .  Did have some vomiting and "too much food in stomach"  gastric emptying normal.  Health Maintenance Due  Topic Date Due   FOOT EXAM  Never done   OPHTHALMOLOGY EXAM  Never done   Diabetic kidney evaluation - Urine ACR  Never done   Hepatitis C Screening  Never done   DTaP/Tdap/Td (1 - Tdap) Never done   PAP SMEAR-Modifier  Never done    Past Medical History:  Diagnosis Date   Colitis    Gestational diabetes mellitus, antepartum    Medical history non-contributory    Postpartum care following repeat cesarean delivery (11/12) 05/18/2015    Past Surgical History:  Procedure Laterality Date   CESAREAN SECTION N/A 02/27/2013   Procedure: CESAREAN SECTION;  Surgeon: Serita Kyle, MD;  Location: [facility];  Service: Obstetrics;  Laterality: N/A;   CESAREAN SECTION N/A 05/18/2015   Procedure: Repeat CESAREAN SECTION;  Surgeon: Maxie Better, MD;  Location: [facility];  Service: Obstetrics;  Laterality:  N/A;  EDD: 05/20/15   CESAREAN SECTION N/A 08/22/2018   Procedure: Repeat CESAREAN SECTION;  Surgeon: Maxie Better, MD;  Location: [facility];  Service: Obstetrics;  Laterality: N/A;  EDD: 08/28/18   CESAREAN SECTION     compound fracture of right elbow  Right compound fracture of right elbow   NO PAST SURGERIES       Current Outpatient Medications:    ALPRAZolam (XANAX) 1 MG tablet, Take 1 mg by mouth at bedtime., Disp: , Rfl:    buPROPion (WELLBUTRIN XL) 150 MG 24 hr tablet, Take 1 tablet (150 mg total) by mouth daily., Disp: 30 tablet, Rfl: 1   busPIRone (BUSPAR) 10 MG tablet, Take 1 tablet (10 mg total) by mouth 2 (two) times daily., Disp: 60 tablet, Rfl: 1   escitalopram (LEXAPRO) 20 MG tablet, Take 1 tablet (20 mg total) by mouth daily., Disp: 90 tablet, Rfl: 1   hydrOXYzine (VISTARIL) 25 MG capsule, Take 1 capsule (25 mg total) by mouth every 8 (eight) hours as needed., Disp: 30 capsule, Rfl: 0   LINZESS 290 MCG CAPS capsule, 1 cap(s) orally 30 minutes before breakfast for 90 days, Disp: , Rfl:    valACYclovir (VALTREX) 500 MG tablet, Take by mouth., Disp: , Rfl:   No Known Allergies ROS neg/noncontributory except as noted HPI/below      Objective:     BP 122/80   Pulse 88   Temp 98.6 F (37 C) (Temporal)   Resp  16   Ht 5\' 6"  (1.676 m)   Wt 140 lb 4 oz (63.6 kg)   SpO2 99%   BMI 22.64 kg/m  Wt Readings from Last 3 Encounters:  11/11/22 140 lb 4 oz (63.6 kg)  09/21/22 140 lb (63.5 kg)  08/06/22 136 lb (61.7 kg)    Physical Exam   Gen: WDWN NAD.  sweating HEENT: NCAT, conjunctiva not injected, sclera nonicteric NECK:  supple, no thyromegaly, no nodes, no carotid bruits CARDIAC: RRR, S1S2+, no murmur. DP 2+B LUNGS: CTAB. No wheezes ABDOMEN:  BS+, soft, NTND, No HSM, no masses EXT:  no edema MSK: no gross abnormalities.  NEURO: A&O x3.  CN II-XII intact.  PSYCH: anxious, fidgetyl mood. Good eye contact     Assessment & Plan:   Anxiety  Irritable bowel syndrome with both constipation and diarrhea  Other orders -     hydrOXYzine Pamoate; Take 1 capsule (25 mg total) by mouth every 8 (eight) hours as needed.  Dispense: 30 capsule; Refill: 0 -     buPROPion HCl ER (XL); Take 1 tablet (150 mg total) by mouth daily.  Dispense: 30 tablet; Refill: 1   Anxiety-chronic.  Not well controlled.  Sweating since increased Lexapro and no motivation.  Not taking buspar regularly.  Discussed Wellbutrin and can make worse, or help.  Will decrease Lexapro to 10 mg(there was some benefit), start Wellbutrin xl 150 mg, take vistaril 25 at night-time for sleep. For now, buspar 10 mg twice daily as needed. If still feeling anxious in few days, take buspar 5 mg twice daily regularly.  Follow up 1 month(s) IBS-mixed.  Seeing Dr. Loreta Ave.  Had colon, CT, gastric emptying.   Angelena Sole, MD

## 2022-11-23 ENCOUNTER — Other Ambulatory Visit: Payer: Self-pay | Admitting: Family Medicine

## 2022-12-09 ENCOUNTER — Ambulatory Visit: Payer: 59 | Admitting: Family Medicine

## 2023-02-10 ENCOUNTER — Other Ambulatory Visit: Payer: Self-pay | Admitting: Family Medicine

## 2023-03-08 ENCOUNTER — Other Ambulatory Visit: Payer: Self-pay | Admitting: Family Medicine

## 2023-03-08 NOTE — Telephone Encounter (Signed)
Needs to sch appt 1-2 months

## 2023-03-09 NOTE — Telephone Encounter (Signed)
Called pt and LVM informing of med refill and nee of f/u within 1-2 months to keep medication current.

## 2023-04-04 ENCOUNTER — Emergency Department (HOSPITAL_COMMUNITY)
Admission: EM | Admit: 2023-04-04 | Discharge: 2023-04-05 | Disposition: A | Discharge status: Home / Self Care | Payer: 59 | Attending: Emergency Medicine | Admitting: Emergency Medicine

## 2023-04-04 ENCOUNTER — Emergency Department (HOSPITAL_COMMUNITY): Payer: 59

## 2023-04-04 ENCOUNTER — Other Ambulatory Visit: Payer: Self-pay

## 2023-04-04 DIAGNOSIS — S5012XA Contusion of left forearm, initial encounter: Secondary | ICD-10-CM | POA: Diagnosis present

## 2023-04-04 DIAGNOSIS — W109XXA Fall (on) (from) unspecified stairs and steps, initial encounter: Secondary | ICD-10-CM | POA: Diagnosis not present

## 2023-04-04 DIAGNOSIS — S60512A Abrasion of left hand, initial encounter: Secondary | ICD-10-CM | POA: Insufficient documentation

## 2023-04-04 DIAGNOSIS — M25531 Pain in right wrist: Secondary | ICD-10-CM

## 2023-04-04 NOTE — ED Triage Notes (Signed)
Patient slipped and fell on wet floor at porch this evening , denies LOC/ambulatory , alert and oriented/respirations unlabored . Reports pain at left hand and right forearm .

## 2023-04-05 ENCOUNTER — Emergency Department (HOSPITAL_COMMUNITY): Payer: 59

## 2023-04-05 NOTE — ED Notes (Signed)
Patient transported to X-ray 

## 2023-04-05 NOTE — ED Notes (Signed)
Ortho tech called to make aware of order.

## 2023-04-05 NOTE — Progress Notes (Signed)
Orthopedic Tech Progress Note Patient Details:  Felicia [name] 05/18/1981 [id number]  Ortho Devices Type of Ortho Device: Velcro wrist splint Ortho Device/Splint Location: RUE Ortho Device/Splint Interventions: Application   Post Interventions Patient Tolerated: Well  Felicia [name] Felicia [name] 04/05/2023, 7:40 AM

## 2023-04-05 NOTE — ED Provider Notes (Signed)
[place] EMERGENCY DEPARTMENT AT [facility] Provider Note   CSN: [id number] Arrival date & time: 04/04/23  2259     History  Chief Complaint  Patient presents with   Hand Injury / Marin Shutter Felicia [name] is a 42 y.o. female.  The history is provided by the patient and medical records.   42 year old female presenting to the ED after a fall on wet steps while taking out the trash.  Fell onto forearm and wrist, no head injury or loss of consciousness.  Majority of her pain is along the right wrist, does have some abrasions to left palm and proximal forearm.  She does have a lot of pain with right wrist movement.  She is right-hand dominant.  No numbness or tingling.  Home Medications Prior to Admission medications   Medication Sig Start Date End Date Taking? Authorizing Provider  ALPRAZolam Prudy Feeler) 1 MG tablet Take 1 mg by mouth at bedtime. 09/18/22   [provider]  buPROPion (WELLBUTRIN XL) 150 MG 24 hr tablet Take 1 tablet (150 mg total) by mouth daily. 11/11/22   Jeani Sow, MD  busPIRone (BUSPAR) 10 MG tablet TAKE 1 TABLET(10 MG) BY MOUTH TWICE DAILY 03/08/23   Jeani Sow, MD  escitalopram (LEXAPRO) 20 MG tablet Take 1 tablet (20 mg total) by mouth daily. 09/21/22   Jeani Sow, MD  hydrOXYzine (VISTARIL) 25 MG capsule TAKE 1 CAPSULE(25 MG) BY MOUTH EVERY 8 HOURS AS NEEDED 02/10/23   Jeani Sow, MD  LINZESS 290 MCG CAPS capsule 1 cap(s) orally 30 minutes before breakfast for 90 days 09/25/22   [provider]  valACYclovir (VALTREX) 500 MG tablet Take by mouth. 07/10/22   [provider]      Allergies    Patient has no known allergies.    Review of Systems   Review of Systems  Musculoskeletal:  Positive for arthralgias.  All other systems reviewed and are negative.   Physical Exam Updated Vital Signs BP 125/82   Pulse 79   Temp 98.9 F (37.2 C) (Oral)   Resp 18   SpO2 100%   Physical Exam Vitals and nursing  note reviewed.  Constitutional:      Appearance: She is well-developed.  HENT:     Head: Normocephalic and atraumatic.     Comments: No visible head trauma Eyes:     Conjunctiva/sclera: Conjunctivae normal.     Pupils: Pupils are equal, round, and reactive to light.  Cardiovascular:     Rate and Rhythm: Normal rate and regular rhythm.     Heart sounds: Normal heart sounds.  Pulmonary:     Effort: Pulmonary effort is normal.     Breath sounds: Normal breath sounds.  Abdominal:     General: Bowel sounds are normal.     Palpations: Abdomen is soft.  Musculoskeletal:        General: Normal range of motion.     Cervical back: Normal range of motion.     Comments: Bruising noted to proximal forearm, ulnar aspect, small hematoma and abrasion noted without acute deformity Bruising and swelling along ulnar aspect of wrist, there is pain with pronation/supination, radial pulse intact, able to make fist but uncomfortable doing so Minor abrasion base of left palm along thenar eminence  Skin:    General: Skin is warm and dry.  Neurological:     Mental Status: She is alert and oriented to person, place, and time.  ED Results / Procedures / Treatments   Labs (all labs ordered are listed, but only abnormal results are displayed) Labs Reviewed - No data to display  EKG None  Radiology DG Forearm Right  Result Date: 04/05/2023 CLINICAL DATA:  Recent fall downstairs with forearm pain, initial encounter EXAM: RIGHT FOREARM - 2 VIEW COMPARISON:  None Available. FINDINGS: There is no evidence of fracture or other focal bone lesions. Soft tissues are unremarkable. IMPRESSION: No acute abnormality noted. Electronically Signed   By: Alcide Clever M.D.   On: 04/05/2023 00:32   DG Hand Complete Left  Result Date: 04/05/2023 CLINICAL DATA:  Recent fall with hand pain, initial encounter EXAM: LEFT HAND - COMPLETE 3+ VIEW COMPARISON:  None Available. FINDINGS: Mild degenerative changes of the first  [facility] joint are noted. No acute fracture or dislocation is noted. No soft tissue abnormality is seen. IMPRESSION: Mild degenerative changes without acute abnormality. Electronically Signed   By: Alcide Clever M.D.   On: 04/05/2023 00:31    Procedures Procedures    Medications Ordered in ED Medications - No data to display  ED Course/ Medical Decision Making/ A&P                                 Medical Decision Making Amount and/or Complexity of Data Reviewed Radiology: ordered and independent interpretation performed.   42 y.o. F here after fall on wet steps this morning while taking out trash.  Fell onto right forearm and left hand.  She denies any head injury or loss of consciousness.  She does have some bruising to the base of left palm along the thenar eminence but no acute bony deformity.  Small hematoma and abrasion to proximal forearm without acute deformity.  She does have a lot of bruising along ulnar aspect of the wrist, pain with pronation and supination.  Radial pulses intact.  Able to make a fist without difficulty.  Forearm and left hand films obtained from triage which are negative.  Dedicated wrist films obtained given findings on exam.  I have reviewed wrist films, no gross fracture seen.  Awaiting formal read.  If no acute fracture, place in wrist splint and can follow-up with PCP.  Symptomatic care for pain/swelling-- she declined medications when offered.  Final Clinical Impression(s) / ED Diagnoses Final diagnoses:  Fall on stairs, initial encounter  Right wrist pain    Rx / DC Orders ED Discharge Orders     None         Garlon Hatchet, PA-C 04/05/23 0657    Nira Conn, MD 04/05/23 [phone]

## 2023-09-21 ENCOUNTER — Other Ambulatory Visit: Payer: Self-pay | Admitting: Family Medicine

## 2023-09-21 NOTE — Telephone Encounter (Signed)
 Needs appt

## 2023-11-09 ENCOUNTER — Other Ambulatory Visit: Payer: Self-pay | Admitting: Family Medicine

## 2023-11-09 NOTE — Telephone Encounter (Signed)
 Needs appt

## 2023-11-16 ENCOUNTER — Ambulatory Visit: Payer: Self-pay | Admitting: Family Medicine

## 2023-11-16 NOTE — Telephone Encounter (Signed)
 Chief Complaint: Chest pain  Symptoms: Body aches in legs and arms started Saturday, chest pressure at 2 AM this morning (lasted a while), intermittent chest pain today lasts > 5 mins 6-7/10 pain level, headache x2 days Frequency: Intermittent  Disposition: [x] ED   Additional Notes: Patient advised to go to ED. This RN offered to call pt an ambulance but pt declined. This RN is unsure if pt will go but pt stated understanding. Pt requests a call back from clinic.    Copied from CRM [id number]. Topic: Clinical - Red Word Triage >> Nov 16, 2023  3:24 PM Howard Macho wrote: Reason for CRM: patient called stating last night she had chest pain, fatigued, shortness of breath and her head was hurting on her temple. Patient states she suffer from colitis and thought it was a flare up because she get sore, achy and joint pain. Patient stated it started last week with tingling in her arms and legs. Patient woke up last night gasping for air. Reason for Disposition  [1] Chest pain lasts > 5 minutes AND [2] history of heart disease (i.e., angina, heart attack, heart failure, bypass surgery, takes nitroglycerin)  Answer Assessment - Initial Assessment Questions Chief Complaint: Chest pain  Symptoms: Body aches in legs and arms started Saturday, chest pressure at 2 AM this morning (lasted a while), intermittent chest pain today lasts > 5 mins 6-7/10 pain level, headache x2 days  Frequency: Intermittent  Protocols used: Chest Pain-A-AH
